# Patient Record
Sex: Female | Born: 1970 | Race: White | Hispanic: No | Smoking: Current every day smoker
Health system: Southern US, Community
[De-identification: ages and names within clinical notes are randomized; demographics above are authoritative.]

## PROBLEM LIST (undated history)

## (undated) DIAGNOSIS — R1011 Right upper quadrant pain: Secondary | ICD-10-CM

## (undated) DIAGNOSIS — F32A Depression, unspecified: Secondary | ICD-10-CM

## (undated) DIAGNOSIS — F329 Major depressive disorder, single episode, unspecified: Secondary | ICD-10-CM

## (undated) DIAGNOSIS — F419 Anxiety disorder, unspecified: Secondary | ICD-10-CM

## (undated) DIAGNOSIS — E7800 Pure hypercholesterolemia, unspecified: Secondary | ICD-10-CM

## (undated) DIAGNOSIS — Z79890 Hormone replacement therapy (postmenopausal): Secondary | ICD-10-CM

## (undated) DIAGNOSIS — G4730 Sleep apnea, unspecified: Secondary | ICD-10-CM

## (undated) HISTORY — DX: Sleep apnea, unspecified: G47.30

## (undated) HISTORY — DX: Right upper quadrant pain: R10.11

## (undated) HISTORY — DX: Hormone replacement therapy: Z79.890

## (undated) HISTORY — PX: ENDOMETRIAL ABLATION: SHX621

## (undated) HISTORY — DX: Major depressive disorder, single episode, unspecified: F32.9

## (undated) HISTORY — DX: Depression, unspecified: F32.A

## (undated) HISTORY — DX: Anxiety disorder, unspecified: F41.9

## (undated) HISTORY — DX: Pure hypercholesterolemia, unspecified: E78.00

---

## 2000-12-27 ENCOUNTER — Other Ambulatory Visit: Admission: RE | Admit: 2000-12-27 | Discharge: 2000-12-27 | Payer: Self-pay | Admitting: Obstetrics and Gynecology

## 2005-08-18 ENCOUNTER — Ambulatory Visit (HOSPITAL_COMMUNITY): Admission: RE | Admit: 2005-08-18 | Discharge: 2005-08-18 | Payer: Self-pay | Admitting: Otolaryngology

## 2005-09-03 ENCOUNTER — Ambulatory Visit (HOSPITAL_COMMUNITY): Admission: RE | Admit: 2005-09-03 | Discharge: 2005-09-03 | Payer: Self-pay | Admitting: Oncology

## 2006-06-08 ENCOUNTER — Ambulatory Visit (HOSPITAL_COMMUNITY): Admission: RE | Admit: 2006-06-08 | Discharge: 2006-06-08 | Payer: Self-pay | Admitting: Obstetrics & Gynecology

## 2006-07-07 ENCOUNTER — Ambulatory Visit (HOSPITAL_COMMUNITY): Admission: RE | Admit: 2006-07-07 | Discharge: 2006-07-07 | Payer: Self-pay | Admitting: Obstetrics & Gynecology

## 2006-07-07 ENCOUNTER — Encounter (INDEPENDENT_AMBULATORY_CARE_PROVIDER_SITE_OTHER): Payer: Self-pay | Admitting: Specialist

## 2007-07-21 ENCOUNTER — Other Ambulatory Visit: Admission: RE | Admit: 2007-07-21 | Discharge: 2007-07-21 | Payer: Self-pay | Admitting: Obstetrics and Gynecology

## 2007-08-18 ENCOUNTER — Emergency Department (HOSPITAL_COMMUNITY): Admission: EM | Admit: 2007-08-18 | Discharge: 2007-08-18 | Payer: Self-pay | Admitting: Emergency Medicine

## 2007-09-26 ENCOUNTER — Ambulatory Visit (HOSPITAL_COMMUNITY): Payer: Self-pay | Admitting: Psychiatry

## 2007-10-26 ENCOUNTER — Ambulatory Visit (HOSPITAL_COMMUNITY): Payer: Self-pay | Admitting: Psychiatry

## 2008-08-27 ENCOUNTER — Other Ambulatory Visit: Admission: RE | Admit: 2008-08-27 | Discharge: 2008-08-27 | Payer: Self-pay | Admitting: Obstetrics and Gynecology

## 2009-11-25 ENCOUNTER — Other Ambulatory Visit: Admission: RE | Admit: 2009-11-25 | Discharge: 2009-11-25 | Payer: Self-pay | Admitting: Obstetrics and Gynecology

## 2010-07-18 NOTE — H&P (Signed)
NAME:  Sabrina Waters, Sabrina Waters                 ACCOUNT NO.:  [id number]   MEDICAL RECORD NO.:  [id number]          PATIENT TYPE:  AMB   LOCATION:  DAY                           FACILITY:  [facility]   PHYSICIAN:  Lazaro Arms, M.D.   DATE OF BIRTH:  June 21, 1970   DATE OF ADMISSION:  07/07/2006  DATE OF DISCHARGE:  LH                              HISTORY & PHYSICAL   Sabrina Waters is a 40 year old white female gravida 1, para 1, who has suffered  with menorrhagia and dysmenorrhea now for quite a number of years.  She  had an ultrasound performed which essentially came back normal, normal  endometrial stripe, and a very small exophytic myoma which is not  clinically relevant.  She declines oral contraceptive therapy because  she does smoke and she is 40 years old and she also has headaches and  she is under the care of [facility].  Her periods are  lasting 7-10 days and they are heavy for 5 of those days with clotting  as well.  We discussed options and decided to proceed with hysteroscopy,  D&C, and endometrial ablation.   PAST MEDICAL HISTORY:  Significant for anxiety, depression, allergies,  hyperlipidemia, and headaches.   PAST SURGICAL HISTORY:  Just numerous mole removals.   BIRTH CONTROL METHOD:  Her husband has had a vasectomy.   PAST OBSTETRICAL HISTORY:  Vaginal delivery.   REVIEW OF SYSTEMS:  Otherwise negative.   MEDICATIONS:  BuSpar, Tagamet, and Allegra as needed.   ALLERGIES:  CIPROFLOXACIN.   PHYSICAL EXAMINATION:  VITAL SIGNS:  Weight is 178 pounds, blood  pressure 128/80.  HEENT:  Unremarkable.  Thyroid is normal.  LUNGS:  Clear.  HEART:  Regular rate and rhythm without murmurs, rubs, or gallops.  BREASTS:  Without mass, discharge, or skin changes.  ABDOMEN:  Benign.  No hepatosplenomegaly or masses.  PELVIC:  She has normal external genitalia.  Vagina is normal female  without discharge.  Uterus and ovaries are normal size and nontender.  EXTREMITIES:  Warm with no  edema.  NEUROLOGY:  Grossly intact.   LABORATORY DATA:  Her last hemoglobin is 14.9, hematocrit 44.3.  Her CMP  was normal.  Her LDL was slightly elevated at 125, HDL 28.   IMPRESSION:  1. Menometrorrhagia.  2. Dysmenorrhea.   PLAN:  The patient is admitted for hysteroscopy, D&C, and endometrial  ablation.  She understands the risks, benefits, indications, and  alternatives and we will proceed.      Lazaro Arms, M.D.  Electronically Signed     LHE/MEDQ  D:  07/06/2006  T:  07/07/2006  Job:  [id number]

## 2010-07-18 NOTE — Op Note (Signed)
NAME:  Sabrina Waters, Sabrina Waters                 ACCOUNT NO.:  [id number]   MEDICAL RECORD NO.:  [id number]          PATIENT TYPE:  AMB   LOCATION:  DAY                           FACILITY:  [facility]   PHYSICIAN:  Lazaro Arms, M.D.   DATE OF BIRTH:  07-05-1970   DATE OF PROCEDURE:  07/07/2006  DATE OF DISCHARGE:                               OPERATIVE REPORT   PREOPERATIVE DIAGNOSES:  1. Menometrorrhagia.  2. Dysmenorrhea.   POSTOPERATIVE DIAGNOSES:  1. Menometrorrhagia.  2. Dysmenorrhea.   PROCEDURES:  1. Hysteroscopy.  2. Dilatation and curettage.  3. Endometrial ablation.   SURGEON:  Lazaro Arms, M.D.   ANESTHESIA:  General endotracheal.   FINDINGS:  The patient had a normal endometrial cavity, a lot of  decidual tissue, endometrial tissue but no polyps and no submucosal  myomas.   DESCRIPTION OF OPERATION:  The patient was taken to the operating room,  placed in supine position and underwent laryngeal mask airway  anesthesia.  She was then placed in dorsal spine position and prepped  and draped in the usual sterile fashion.  A Graves speculum was placed.  The cervix was grasped.  The cervix was dilated serially to allow  passage of the hysteroscope.  Hysteroscopy was performed and, again, the  above-noted findings were seen.  A vigorous uterine curettage was  performed.  A large amount of tissue was returned and sent to Pathology  for evaluation.  ThermaChoice III endometrial ablation was then  performed.  Total therapy time was 23 minutes and 2 seconds.  It could  not quite heat up to 87 degrees Celsius.  We heated up to 85 degrees  three times and finally got to 87 the last time.  We suspect there may  have been an aging  cord, but she did receive the appropriate  therapy with the 8 minutes of actual treatment cycle and required 42 mL  of D5W to maintain a pressure of 200 mmHg.  The patient was awoke from  anesthesia and taken to the recovery room in good and stable  condition.  All counts were correct.      Lazaro Arms, M.D.  Electronically Signed     LHE/MEDQ  D:  07/07/2006  T:  07/07/2006  Job:  [id number]

## 2010-11-27 LAB — POCT CARDIAC MARKERS
CKMB, poc: <1 — ABNORMAL LOW
Myoglobin, poc: 35.7
Troponin i, poc: <0.05

## 2010-11-27 LAB — POCT I-STAT, CHEM 8
Chloride: 106
Creatinine, Ser: 0.7
Sodium: 141
TCO2: 26

## 2011-02-10 ENCOUNTER — Other Ambulatory Visit (HOSPITAL_COMMUNITY)
Admission: RE | Admit: 2011-02-10 | Discharge: 2011-02-10 | Disposition: A | Discharge status: Home / Self Care | Payer: BC Managed Care – PPO | Source: Ambulatory Visit | Attending: Obstetrics and Gynecology | Admitting: Obstetrics and Gynecology

## 2011-02-10 ENCOUNTER — Other Ambulatory Visit: Payer: Self-pay | Admitting: Adult Health

## 2011-02-10 DIAGNOSIS — Z01419 Encounter for gynecological examination (general) (routine) without abnormal findings: Secondary | ICD-10-CM | POA: Insufficient documentation

## 2011-05-05 ENCOUNTER — Other Ambulatory Visit: Payer: Self-pay | Admitting: Obstetrics and Gynecology

## 2011-06-26 ENCOUNTER — Other Ambulatory Visit: Payer: Self-pay | Admitting: Obstetrics and Gynecology

## 2012-09-12 ENCOUNTER — Ambulatory Visit (INDEPENDENT_AMBULATORY_CARE_PROVIDER_SITE_OTHER): Payer: BC Managed Care – PPO | Admitting: Adult Health

## 2012-09-12 ENCOUNTER — Encounter: Payer: Self-pay | Admitting: Adult Health

## 2012-09-12 VITALS — BP 122/86 | Ht 64.0 in | Wt 180.0 lb

## 2012-09-12 DIAGNOSIS — E7800 Pure hypercholesterolemia, unspecified: Secondary | ICD-10-CM | POA: Insufficient documentation

## 2012-09-12 DIAGNOSIS — F411 Generalized anxiety disorder: Secondary | ICD-10-CM

## 2012-09-12 DIAGNOSIS — F329 Major depressive disorder, single episode, unspecified: Secondary | ICD-10-CM

## 2012-09-12 DIAGNOSIS — F419 Anxiety disorder, unspecified: Secondary | ICD-10-CM | POA: Insufficient documentation

## 2012-09-12 DIAGNOSIS — R5383 Other fatigue: Secondary | ICD-10-CM

## 2012-09-12 DIAGNOSIS — F32A Depression, unspecified: Secondary | ICD-10-CM | POA: Insufficient documentation

## 2012-09-12 DIAGNOSIS — R5381 Other malaise: Secondary | ICD-10-CM

## 2012-09-12 LAB — COMPREHENSIVE METABOLIC PANEL
AST: 21 U/L (ref 0–37)
Albumin: 4.4 g/dL (ref 3.5–5.2)
Alkaline Phosphatase: 64 U/L (ref 39–117)
Calcium: 9.3 mg/dL (ref 8.4–10.5)
Chloride: 106 mEq/L (ref 96–112)
Creat: 0.8 mg/dL (ref 0.50–1.10)
Glucose, Bld: 82 mg/dL (ref 70–99)
Potassium: 4.6 mEq/L (ref 3.5–5.3)
Sodium: 141 mEq/L (ref 135–145)
Total Bilirubin: 0.3 mg/dL (ref 0.3–1.2)

## 2012-09-12 LAB — CBC
Hemoglobin: 15.3 g/dL — ABNORMAL HIGH (ref 12.0–15.0)
MCV: 91.5 fL (ref 78.0–100.0)
WBC: 8.5 10*3/uL (ref 4.0–10.5)

## 2012-09-12 NOTE — Progress Notes (Signed)
Subjective:     Patient ID: Sabrina Waters, female   DOB: 01-23-1971, 42 y.o.   MRN: [id number]  HPI Sabrina Waters is a 42 year old white female in complaining that meds not working as well.She is teary at times,wants to stay home more, does not sleep well and feels hot at night and she is tired all the time.She had an endo ablation so she does not have periods.She denies any suicidal ideations.  Review of Systems Positives in HPI Reviewed past medical,surgical, social and family history. Reviewed medications and allergies.     Objective:   Physical Exam BP 122/86  Ht 5\' 4"  (1.626 m)  Wt 180 lb (81.647 kg)  BMI 30.88 kg/m2   Talk only-see HPI  Assessment:      Depression Anxiety Fatigue    Plan:      Check CBC,CMP,TSH and FSH, to rule out anemia,hypothyroid and menopause Will follow labs in am and discuss any med changes then Follow up in 4 weeks Review handout on depression

## 2012-09-12 NOTE — Patient Instructions (Addendum)

## 2012-09-13 ENCOUNTER — Telehealth: Payer: Self-pay | Admitting: Adult Health

## 2012-09-13 NOTE — Telephone Encounter (Signed)
Pt aware of labs results and that FSH 41.5.discused HRT and pt to research it and let me know if she wants to try it.

## 2012-09-14 ENCOUNTER — Telehealth: Payer: Self-pay | Admitting: Adult Health

## 2012-09-14 MED ORDER — ESTRADIOL 0.52 MG/0.87 GM (0.06%) TD GEL: TRANSDERMAL | Status: DC

## 2012-09-14 MED ORDER — PROGESTERONE MICRONIZED 200 MG PO CAPS: ORAL_CAPSULE | ORAL | Status: DC

## 2012-09-14 NOTE — Telephone Encounter (Signed)
Pt wants to try HRT will call to [facility]

## 2012-09-20 ENCOUNTER — Encounter: Payer: Self-pay | Admitting: Adult Health

## 2012-10-10 ENCOUNTER — Ambulatory Visit: Payer: BC Managed Care – PPO | Admitting: Adult Health

## 2012-11-17 ENCOUNTER — Other Ambulatory Visit: Payer: Self-pay | Admitting: Adult Health

## 2013-01-05 ENCOUNTER — Other Ambulatory Visit: Payer: Self-pay

## 2013-01-11 ENCOUNTER — Ambulatory Visit (INDEPENDENT_AMBULATORY_CARE_PROVIDER_SITE_OTHER): Payer: BC Managed Care – PPO | Admitting: Adult Health

## 2013-01-11 ENCOUNTER — Encounter: Payer: Self-pay | Admitting: Adult Health

## 2013-01-11 ENCOUNTER — Other Ambulatory Visit (HOSPITAL_COMMUNITY)
Admission: RE | Admit: 2013-01-11 | Discharge: 2013-01-11 | Disposition: A | Discharge status: Home / Self Care | Payer: BC Managed Care – PPO | Source: Ambulatory Visit | Attending: Adult Health | Admitting: Adult Health

## 2013-01-11 VITALS — BP 126/80 | HR 72 | Ht 64.75 in | Wt 184.0 lb

## 2013-01-11 DIAGNOSIS — Z1151 Encounter for screening for human papillomavirus (HPV): Secondary | ICD-10-CM | POA: Insufficient documentation

## 2013-01-11 DIAGNOSIS — Z1212 Encounter for screening for malignant neoplasm of rectum: Secondary | ICD-10-CM

## 2013-01-11 DIAGNOSIS — Z01419 Encounter for gynecological examination (general) (routine) without abnormal findings: Secondary | ICD-10-CM | POA: Insufficient documentation

## 2013-01-11 DIAGNOSIS — R1011 Right upper quadrant pain: Secondary | ICD-10-CM | POA: Insufficient documentation

## 2013-01-11 DIAGNOSIS — F329 Major depressive disorder, single episode, unspecified: Secondary | ICD-10-CM

## 2013-01-11 DIAGNOSIS — F419 Anxiety disorder, unspecified: Secondary | ICD-10-CM

## 2013-01-11 DIAGNOSIS — Z79890 Hormone replacement therapy (postmenopausal): Secondary | ICD-10-CM | POA: Insufficient documentation

## 2013-01-11 DIAGNOSIS — E7800 Pure hypercholesterolemia, unspecified: Secondary | ICD-10-CM

## 2013-01-11 HISTORY — DX: Right upper quadrant pain: R10.11

## 2013-01-11 LAB — COMPREHENSIVE METABOLIC PANEL
ALT: 18 U/L (ref 0–35)
Albumin: 4.1 g/dL (ref 3.5–5.2)
Alkaline Phosphatase: 60 U/L (ref 39–117)
BUN: 7 mg/dL (ref 6–23)
Calcium: 9.4 mg/dL (ref 8.4–10.5)
Glucose, Bld: 106 mg/dL — ABNORMAL HIGH (ref 70–99)
Sodium: 138 mEq/L (ref 135–145)
Total Bilirubin: 0.4 mg/dL (ref 0.3–1.2)

## 2013-01-11 LAB — LIPID PANEL
Cholesterol: 227 mg/dL — ABNORMAL HIGH (ref 0–200)
LDL Cholesterol: 153 mg/dL — ABNORMAL HIGH (ref 0–99)
Total CHOL/HDL Ratio: 8.1 Ratio
Triglycerides: 228 mg/dL — ABNORMAL HIGH (ref <150)
VLDL: 46 mg/dL — ABNORMAL HIGH (ref 0–40)

## 2013-01-11 LAB — HEMOCCULT GUIAC POC 1CARD (OFFICE): Fecal Occult Blood, POC: NEGATIVE

## 2013-01-11 LAB — CBC
MCHC: 34.6 g/dL (ref 30.0–36.0)
MCV: 93.1 fL (ref 78.0–100.0)
Platelets: 246 10*3/uL (ref 150–400)
RDW: 13 % (ref 11.5–15.5)
WBC: 8.3 10*3/uL (ref 4.0–10.5)

## 2013-01-11 LAB — TSH: TSH: 1.087 u[IU]/mL (ref 0.350–4.500)

## 2013-01-11 NOTE — Progress Notes (Signed)
Patient ID: Sabrina Waters, female   DOB: Oct 23, 1970, 42 y.o.   MRN: [id number] History of Present Illness: [name] is a 42 year old white female,divorced in for pap and physical.Complains of bloating,nausea and BMs weekly.She likes her HRT.Has taken tagamet OTC with little relief.   Current Medications, Allergies, Past Medical History, Past Surgical History, Family History and Social History were reviewed in [employer] record.   Past Medical History  Diagnosis Date  . Anxiety   . Depression   . Elevated cholesterol   . Hormone replacement therapy (HRT)   . RUQ pain 01/11/2013  Current outpatient prescriptions:buPROPion (WELLBUTRIN SR) 150 MG 12 hr tablet, Take 150 mg by mouth 2 (two) times daily., Disp: , Rfl: ;  busPIRone (BUSPAR) 15 MG tablet, Take 15 mg by mouth 3 (three) times daily., Disp: , Rfl: ;  Estradiol 0.52 MG/0.87 GM (0.06%) GEL, Apply once daily to upper arm, Disp: 144 g, Rfl: 3;  progesterone (PROMETRIUM) 200 MG capsule, Take 1 200 mg capsule by mouth at bedtime, Disp: 30 capsule, Rfl: 3  Past Surgical History  Procedure Laterality Date  . Endometrial ablation      Review of Systems: Patient denies any headaches, blurred vision, shortness of breath, chest pain, problems with urination, or intercourse. No joint pain , moods good with meds.Positives in HPI.    Physical Exam:BP 126/80  Pulse 72  Ht 5' 4.75" (1.645 m)  Wt 184 lb (83.462 kg)  BMI 30.84 kg/m2 General:  Well developed, well nourished, no acute distress Skin:  Warm and dry Neck:  Midline trachea, normal thyroid Lungs; Clear to auscultation bilaterally Breast:  No dominant palpable mass, retraction, or nipple discharge Cardiovascular: Regular rate and rhythm Abdomen:  Soft, no hepatosplenomegaly, has pain RUQ with palpation Pelvic:  External genitalia is normal in appearance.  The vagina is normal in appearance.  The cervix is smooth, pap performed with HPV.  Uterus is felt to be normal  size, shape, and contour.  No adnexal masses or tenderness noted. Rectal: Good sphincter tone, no polyps, or hemorrhoids felt.  Hemoccult negative. Extremities:  No swelling or varicosities noted Psych:  No mood changes, alert and cooperative, seems happy   Impression: Yearly gyn exam HRT RUQ pain, bloating with nausea and some constipation Anxiety and depression History of elevated cholesterol    Plan: Check CBC,CMP,TSH and lipids Abdominal US 11/17 1t 8 am at [facility] Physical in 1 year Mammogram now and yearly Get flu shot  Eat bland Continue current meds

## 2013-01-11 NOTE — Patient Instructions (Signed)
Physical in 1 year Mammogram now and yearly Follow up labs in 24 -48 hours Get GB US 11/17 at 8 am at [facility] do not eat or drink after midnight be there at 7:45 am Get flu shot  Eat bland

## 2013-01-13 ENCOUNTER — Telehealth: Payer: Self-pay | Admitting: Adult Health

## 2013-01-13 NOTE — Telephone Encounter (Signed)
Left message to call back about labs

## 2013-01-16 ENCOUNTER — Telehealth: Payer: Self-pay | Admitting: Adult Health

## 2013-01-16 ENCOUNTER — Ambulatory Visit (HOSPITAL_COMMUNITY)
Admission: RE | Admit: 2013-01-16 | Discharge: 2013-01-16 | Disposition: A | Discharge status: Home / Self Care | Payer: BC Managed Care – PPO | Source: Ambulatory Visit | Attending: Adult Health | Admitting: Adult Health

## 2013-01-16 DIAGNOSIS — R1011 Right upper quadrant pain: Secondary | ICD-10-CM | POA: Insufficient documentation

## 2013-01-16 DIAGNOSIS — Z9089 Acquired absence of other organs: Secondary | ICD-10-CM | POA: Insufficient documentation

## 2013-01-16 MED ORDER — ATORVASTATIN CALCIUM 20 MG PO TABS: 20.0000 mg | ORAL_TABLET | Freq: Every day | ORAL | Status: DC

## 2013-01-16 NOTE — Telephone Encounter (Signed)
Pt informed elevated cholesterol and triglycerides. Meds to be prescribed by Cyril Mourning, NP Pt also informed of ultrasound results of fatty liver pt to exercise and diet per Genelle Gather. Pt verbalized understanding will call office back in 8 weeks to repeat labs.

## 2013-01-16 NOTE — Addendum Note (Signed)
Addended by: Cyril Mourning A on: 01/16/2013 01:49 PM   Modules accepted: Orders

## 2013-01-23 ENCOUNTER — Other Ambulatory Visit: Payer: Self-pay | Admitting: Adult Health

## 2013-01-23 ENCOUNTER — Encounter: Payer: Self-pay | Admitting: Adult Health

## 2013-01-23 MED ORDER — SIMVASTATIN 20 MG PO TABS: 20.0000 mg | ORAL_TABLET | Freq: Every day | ORAL | Status: DC

## 2013-01-24 ENCOUNTER — Other Ambulatory Visit: Payer: Self-pay | Admitting: Adult Health

## 2013-02-27 ENCOUNTER — Other Ambulatory Visit: Payer: Self-pay | Admitting: Obstetrics and Gynecology

## 2013-04-07 ENCOUNTER — Other Ambulatory Visit: Payer: Self-pay | Admitting: Obstetrics and Gynecology

## 2013-12-18 ENCOUNTER — Telehealth: Payer: Self-pay | Admitting: Adult Health

## 2013-12-18 MED ORDER — SIMVASTATIN 20 MG PO TABS: 20.0000 mg | ORAL_TABLET | Freq: Every day | ORAL | Status: DC

## 2013-12-18 MED ORDER — PROGESTERONE MICRONIZED 200 MG PO CAPS: ORAL_CAPSULE | ORAL | Status: DC

## 2013-12-18 MED ORDER — BUPROPION HCL ER (SR) 150 MG PO TB12: ORAL_TABLET | ORAL | Status: DC

## 2013-12-18 MED ORDER — BUSPIRONE HCL 15 MG PO TABS: 15.0000 mg | ORAL_TABLET | Freq: Two times a day (BID) | ORAL | Status: DC

## 2013-12-18 NOTE — Telephone Encounter (Signed)
Needs refills on meds wants 90 day supply

## 2014-01-01 ENCOUNTER — Encounter: Payer: Self-pay | Admitting: Adult Health

## 2014-06-04 ENCOUNTER — Encounter: Payer: Self-pay | Admitting: Adult Health

## 2014-06-07 ENCOUNTER — Telehealth: Payer: Self-pay | Admitting: Adult Health

## 2014-06-07 MED ORDER — ESTRADIOL 1 MG PO TABS: 1.0000 mg | ORAL_TABLET | Freq: Every day | ORAL | Status: DC

## 2014-06-07 NOTE — Telephone Encounter (Signed)
Will rx estrace and stop gel due to cost, feels moody

## 2014-06-07 NOTE — Telephone Encounter (Signed)
Will stop gel will rx estrace po

## 2014-12-20 ENCOUNTER — Other Ambulatory Visit: Payer: Self-pay | Admitting: Adult Health

## 2015-01-16 ENCOUNTER — Other Ambulatory Visit: Payer: Self-pay | Admitting: Adult Health

## 2015-06-17 ENCOUNTER — Observation Stay (HOSPITAL_COMMUNITY)
Admission: EM | Admit: 2015-06-17 | Discharge: 2015-06-18 | Disposition: A | Discharge status: Home / Self Care | Payer: 59 | Attending: Internal Medicine | Admitting: Internal Medicine

## 2015-06-17 ENCOUNTER — Observation Stay (HOSPITAL_COMMUNITY): Payer: 59

## 2015-06-17 ENCOUNTER — Encounter (HOSPITAL_COMMUNITY): Payer: Self-pay | Admitting: *Deleted

## 2015-06-17 ENCOUNTER — Emergency Department (HOSPITAL_COMMUNITY): Payer: 59

## 2015-06-17 DIAGNOSIS — Z79899 Other long term (current) drug therapy: Secondary | ICD-10-CM | POA: Insufficient documentation

## 2015-06-17 DIAGNOSIS — F17210 Nicotine dependence, cigarettes, uncomplicated: Secondary | ICD-10-CM | POA: Insufficient documentation

## 2015-06-17 DIAGNOSIS — M791 Myalgia: Secondary | ICD-10-CM | POA: Diagnosis not present

## 2015-06-17 DIAGNOSIS — R200 Anesthesia of skin: Principal | ICD-10-CM

## 2015-06-17 DIAGNOSIS — F329 Major depressive disorder, single episode, unspecified: Secondary | ICD-10-CM | POA: Diagnosis not present

## 2015-06-17 DIAGNOSIS — G43909 Migraine, unspecified, not intractable, without status migrainosus: Secondary | ICD-10-CM | POA: Diagnosis not present

## 2015-06-17 DIAGNOSIS — G43109 Migraine with aura, not intractable, without status migrainosus: Secondary | ICD-10-CM | POA: Diagnosis present

## 2015-06-17 DIAGNOSIS — E785 Hyperlipidemia, unspecified: Secondary | ICD-10-CM | POA: Insufficient documentation

## 2015-06-17 DIAGNOSIS — F419 Anxiety disorder, unspecified: Secondary | ICD-10-CM | POA: Diagnosis present

## 2015-06-17 DIAGNOSIS — R208 Other disturbances of skin sensation: Secondary | ICD-10-CM | POA: Diagnosis not present

## 2015-06-17 DIAGNOSIS — E7800 Pure hypercholesterolemia, unspecified: Secondary | ICD-10-CM | POA: Diagnosis present

## 2015-06-17 DIAGNOSIS — F32A Depression, unspecified: Secondary | ICD-10-CM | POA: Diagnosis present

## 2015-06-17 HISTORY — DX: Anesthesia of skin: R20.0

## 2015-06-17 LAB — I-STAT CHEM 8, ED
BUN: 10 mg/dL (ref 6–20)
CALCIUM ION: 1.14 mmol/L (ref 1.12–1.23)
CHLORIDE: 102 mmol/L (ref 101–111)
CREATININE: 0.7 mg/dL (ref 0.44–1.00)
GLUCOSE: 98 mg/dL (ref 65–99)
HCT: 45 % (ref 36.0–46.0)
Hemoglobin: 15.3 g/dL — ABNORMAL HIGH (ref 12.0–15.0)
Potassium: 3.6 mmol/L (ref 3.5–5.1)
Sodium: 141 mmol/L (ref 135–145)
TCO2: 25 mmol/L (ref 0–100)

## 2015-06-17 LAB — URINALYSIS, ROUTINE W REFLEX MICROSCOPIC
Bilirubin Urine: NEGATIVE
GLUCOSE, UA: NEGATIVE mg/dL
HGB URINE DIPSTICK: NEGATIVE
Ketones, ur: NEGATIVE mg/dL
Nitrite: NEGATIVE
PH: 5.5 (ref 5.0–8.0)
PROTEIN: NEGATIVE mg/dL
Specific Gravity, Urine: 1.01 (ref 1.005–1.030)

## 2015-06-17 LAB — COMPREHENSIVE METABOLIC PANEL
ALK PHOS: 64 U/L (ref 38–126)
ALT: 20 U/L (ref 14–54)
AST: 18 U/L (ref 15–41)
Albumin: 4.3 g/dL (ref 3.5–5.0)
Anion gap: 10 (ref 5–15)
BILIRUBIN TOTAL: 0.3 mg/dL (ref 0.3–1.2)
BUN: 11 mg/dL (ref 6–20)
CALCIUM: 9.3 mg/dL (ref 8.9–10.3)
CHLORIDE: 104 mmol/L (ref 101–111)
CO2: 25 mmol/L (ref 22–32)
CREATININE: 0.74 mg/dL (ref 0.44–1.00)
GFR calc Af Amer: >60 mL/min (ref >60)
Glucose, Bld: 104 mg/dL — ABNORMAL HIGH (ref 65–99)
Potassium: 3.8 mmol/L (ref 3.5–5.1)
Sodium: 139 mmol/L (ref 135–145)
TOTAL PROTEIN: 7.7 g/dL (ref 6.5–8.1)

## 2015-06-17 LAB — DIFFERENTIAL
BASOS ABS: 0.1 10*3/uL (ref 0.0–0.1)
Basophils Relative: 0 %
Eosinophils Absolute: 0.1 10*3/uL (ref 0.0–0.7)
Eosinophils Relative: 1 %
LYMPHS ABS: 4.8 10*3/uL — AB (ref 0.7–4.0)
LYMPHS PCT: 37 %
MONOS PCT: 6 %
Monocytes Absolute: 0.8 10*3/uL (ref 0.1–1.0)
NEUTROS ABS: 7.4 10*3/uL (ref 1.7–7.7)
Neutrophils Relative %: 56 %

## 2015-06-17 LAB — RAPID URINE DRUG SCREEN, HOSP PERFORMED
Amphetamines: NOT DETECTED
BARBITURATES: NOT DETECTED
BENZODIAZEPINES: NOT DETECTED
Cocaine: NOT DETECTED
Opiates: NOT DETECTED
Tetrahydrocannabinol: NOT DETECTED

## 2015-06-17 LAB — PROTIME-INR
INR: 1.01 (ref 0.00–1.49)
Prothrombin Time: 13.5 seconds (ref 11.6–15.2)

## 2015-06-17 LAB — CBC
HEMATOCRIT: 44.8 % (ref 36.0–46.0)
HEMOGLOBIN: 15.1 g/dL — AB (ref 12.0–15.0)
MCH: 31.8 pg (ref 26.0–34.0)
MCHC: 33.7 g/dL (ref 30.0–36.0)
MCV: 94.3 fL (ref 78.0–100.0)
Platelets: 264 10*3/uL (ref 150–400)
RBC: 4.75 MIL/uL (ref 3.87–5.11)
RDW: 12.2 % (ref 11.5–15.5)
WBC: 13.1 10*3/uL — AB (ref 4.0–10.5)

## 2015-06-17 LAB — URINE MICROSCOPIC-ADD ON

## 2015-06-17 LAB — PREGNANCY, URINE: Preg Test, Ur: NEGATIVE

## 2015-06-17 LAB — I-STAT TROPONIN, ED: TROPONIN I, POC: 0 ng/mL (ref 0.00–0.08)

## 2015-06-17 LAB — ETHANOL: Alcohol, Ethyl (B): <5 mg/dL (ref <5)

## 2015-06-17 LAB — TSH: TSH: 2.075 u[IU]/mL (ref 0.350–4.500)

## 2015-06-17 LAB — APTT: APTT: 26 s (ref 24–37)

## 2015-06-17 MED ORDER — ENOXAPARIN SODIUM 40 MG/0.4ML ~~LOC~~ SOLN
40.0000 mg | SUBCUTANEOUS | Status: DC
  Administered 2015-06-17: 40 mg via SUBCUTANEOUS
  Filled 2015-06-17: qty 0.4

## 2015-06-17 MED ORDER — ESTRADIOL 1 MG PO TABS
ORAL_TABLET | ORAL | Status: AC
  Filled 2015-06-17: qty 1

## 2015-06-17 MED ORDER — ESTRADIOL 1 MG PO TABS
1.0000 mg | ORAL_TABLET | Freq: Every day | ORAL | Status: DC
  Administered 2015-06-17: 1 mg via ORAL
  Filled 2015-06-17 (×2): qty 1

## 2015-06-17 MED ORDER — TOPIRAMATE 25 MG PO TABS
ORAL_TABLET | ORAL | Status: AC
  Filled 2015-06-17: qty 1

## 2015-06-17 MED ORDER — BUSPIRONE HCL 5 MG PO TABS
15.0000 mg | ORAL_TABLET | Freq: Two times a day (BID) | ORAL | Status: DC
  Administered 2015-06-17 – 2015-06-18 (×2): 15 mg via ORAL
  Filled 2015-06-17 (×2): qty 3

## 2015-06-17 MED ORDER — VITAMIN C 500 MG PO TABS
500.0000 mg | ORAL_TABLET | Freq: Two times a day (BID) | ORAL | Status: DC
  Administered 2015-06-17 – 2015-06-18 (×2): 500 mg via ORAL
  Filled 2015-06-17 (×3): qty 1

## 2015-06-17 MED ORDER — ASPIRIN 81 MG PO CHEW
81.0000 mg | CHEWABLE_TABLET | Freq: Every day | ORAL | Status: DC
  Administered 2015-06-17 – 2015-06-18 (×2): 81 mg via ORAL
  Filled 2015-06-17 (×2): qty 1

## 2015-06-17 MED ORDER — BUPROPION HCL ER (SR) 150 MG PO TB12
ORAL_TABLET | ORAL | Status: AC
  Filled 2015-06-17: qty 1

## 2015-06-17 MED ORDER — SIMVASTATIN 20 MG PO TABS
20.0000 mg | ORAL_TABLET | Freq: Every day | ORAL | Status: DC
  Administered 2015-06-17: 20 mg via ORAL
  Filled 2015-06-17: qty 1

## 2015-06-17 MED ORDER — PROGESTERONE MICRONIZED 200 MG PO CAPS
200.0000 mg | ORAL_CAPSULE | Freq: Every day | ORAL | Status: DC
  Administered 2015-06-17: 200 mg via ORAL
  Filled 2015-06-17 (×2): qty 1

## 2015-06-17 MED ORDER — ONDANSETRON HCL 4 MG/2ML IJ SOLN: 4.0000 mg | Freq: Three times a day (TID) | INTRAMUSCULAR | Status: AC | PRN

## 2015-06-17 MED ORDER — TOPIRAMATE 25 MG PO TABS
25.0000 mg | ORAL_TABLET | Freq: Two times a day (BID) | ORAL | Status: DC
  Administered 2015-06-17 – 2015-06-18 (×2): 25 mg via ORAL
  Filled 2015-06-17 (×5): qty 1

## 2015-06-17 MED ORDER — NICOTINE 14 MG/24HR TD PT24
14.0000 mg | MEDICATED_PATCH | Freq: Every day | TRANSDERMAL | Status: DC
  Administered 2015-06-17 – 2015-06-18 (×2): 14 mg via TRANSDERMAL
  Filled 2015-06-17 (×2): qty 1

## 2015-06-17 MED ORDER — BUPROPION HCL ER (SR) 150 MG PO TB12
150.0000 mg | ORAL_TABLET | Freq: Two times a day (BID) | ORAL | Status: DC
  Administered 2015-06-17 – 2015-06-18 (×2): 150 mg via ORAL
  Filled 2015-06-17 (×5): qty 1

## 2015-06-17 NOTE — H&P (Signed)
[facility] History and Physical  Sabrina PilgrimRobin C Beyene WUJ:[id number]:[id number] DOB: [birth year]    PCP:   Cyril MourningJennifer Griffin, NP   Chief Complaint: HA and numbness of left face.  HPI: Sabrina Waters is an 45 y.o. 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  He has no nausea, vomiting, but admitted to have HA.  No visual changes, and no focal weakness.  There was no confusion.  Work up in the ER included a negative head CT, and serology was not remarkable.  She had been on Topamax before, and stopped, but did not recall any bad side effects.  She does smoke about 1ppd, and drink socially.  She denied drug use.  ECG showed sinus tach and no acute ST T changes.  Neurotelemetry was consulted by EDP and recommended admission to r/out CVA.   Rewiew of Systems:  Constitutional: Negative for malaise, fever and chills. No significant weight loss or weight gain Eyes: Negative for eye pain, redness and discharge, diplopia, visual changes, or flashes of light. ENMT: Negative for ear pain, hoarseness, nasal congestion, sinus pressure and sore throat. No headaches; tinnitus, drooling, or problem swallowing. Cardiovascular: Negative for chest pain, palpitations, diaphoresis, dyspnea and peripheral edema. ; No orthopnea, PND Respiratory: Negative for cough, hemoptysis, wheezing and stridor. No pleuritic chestpain. Gastrointestinal: Negative for nausea, vomiting, diarrhea, constipation, abdominal pain, melena, blood in stool, hematemesis, jaundice and rectal bleeding.    Genitourinary: Negative for frequency, dysuria, incontinence,flank pain and hematuria; Musculoskeletal: Negative for back pain and neck pain. Negative for swelling and trauma.;  Skin: . Negative for pruritus, rash, abrasions,  bruising and skin lesion.; ulcerations Neuro: Negative for headache, lightheadedness and neck stiffness. Negative for weakness, altered level of consciousness , altered mental status, extremity weakness, burning feet, involuntary movement, seizure and syncope.  Psych: negative for anxiety, depression, insomnia, tearfulness, panic attacks, hallucinations, paranoia, suicidal or homicidal ideation    Past Medical History  Diagnosis Date  . Anxiety   . Depression   . Elevated cholesterol   . Hormone replacement therapy (HRT)   . RUQ pain 01/11/2013    Past Surgical History  Procedure Laterality Date  . Endometrial ablation      Medications:  HOME MEDS: Prior to Admission medications   Medication Sig Start Date End Date Taking? Authorizing Provider  buPROPion (WELLBUTRIN SR) 150 MG 12 hr tablet TAKE 1 TABLET BY MOUTH TWICE DAILY 12/20/14  Yes Adline PotterJennifer A Griffin, NP  busPIRone (BUSPAR) 15 MG tablet TAKE 1 TABLET BY MOUTH TWICE DAILY 12/20/14  Yes Adline PotterJennifer A Griffin, NP  estradiol (ESTRACE) 1 MG tablet Take 1 tablet (1 mg total) by mouth daily. Patient taking differently: Take 1 mg by mouth at bedtime.  06/07/14  Yes Adline PotterJennifer A Griffin, NP  progesterone (PROMETRIUM) 200 MG capsule TAKE 1 CAPSULE BY MOUTH EVERY NIGHT AT BEDTIME 01/16/15  Yes Adline PotterJennifer A Griffin, NP  Pseudoephedrine-Ibuprofen (ADVIL COLD/SINUS PO) Take 1 tablet by mouth daily.   Yes Historical Provider, MD  simvastatin (ZOCOR) 20 MG tablet TAKE 1 TABLET BY MOUTH DAILY Patient taking differently: TAKE 1 TABLET BY MOUTH AT BEDTIME. 01/16/15  Yes Adline PotterJennifer A Griffin, NP     Allergies:  Allergies  Allergen Reactions  . Sulfa Antibiotics Nausea And Vomiting    Social  History:   reports that she has been smoking Cigarettes.  She has a 12.5 pack-year smoking history. She has never used smokeless tobacco. She reports that she does not drink alcohol or use illicit drugs.  Family History: Family History  Problem Relation Age  of Onset  . Hypertension Mother   . Mental illness Sister   . Hypertension Brother      Physical Exam: Filed Vitals:   06/17/15 1330 06/17/15 1402 06/17/15 1403 06/17/15 1430  BP: 109/69 125/85 125/85 124/77  Pulse: 117 112 114 106  Resp: SpO2: 99% 97% 97% 96%   Blood pressure 124/77, pulse 106, resp. rate 15, SpO2 96 %.  GEN:  Pleasant  patient lying in the stretcher in no acute distress; cooperative with exam. PSYCH:  alert and oriented x4; does not appear anxious or depressed; affect is appropriate. HEENT: Mucous membranes pink and anicteric; PERRLA; EOM intact; no cervical lymphadenopathy nor thyromegaly or carotid bruit; no JVD; There were no stridor. Neck is very supple. Breasts:: Not examined CHEST WALL: No tenderness CHEST: Normal respiration, clear to auscultation bilaterally.  HEART: Regular rate and rhythm.  There are no murmur, rub, or gallops.   BACK: No kyphosis or scoliosis; no CVA tenderness ABDOMEN: soft and non-tender; no masses, no organomegaly, normal abdominal bowel sounds; no pannus; no intertriginous candida. There is no rebound and no distention. Rectal Exam: Not done EXTREMITIES: No bone or joint deformity; age-appropriate arthropathy of the hands and knees; no edema; no ulcerations.  There is no calf tenderness. Genitalia: not examined PULSES: 2+ and symmetric SKIN: Normal hydration no rash or ulceration CNS: Cranial nerves 2-12 grossly intact no focal lateralizing neurologic deficit.  Speech is fluent; uvula elevated with phonation, facial symmetry and tongue midline. DTR are normal bilaterally, cerebella exam is intact, barbinski is negative and strengths are equaled bilaterally.  No sensory loss.   Labs on Admission:  Basic Metabolic Panel:  Recent Labs Lab 06/17/15 1230 06/17/15 1242  NA 139 141  K 3.8 3.6  CL 104 102  CO2 25  --   GLUCOSE 104* 98  BUN 11 10  CREATININE 0.74 0.70  CALCIUM 9.3  --    Liver Function  Tests:  Recent Labs Lab 06/17/15 1230  AST 18  ALT 20  ALKPHOS 64  BILITOT 0.3  PROT 7.7  ALBUMIN 4.3   CBC:  Recent Labs Lab 06/17/15 1230 06/17/15 1242  WBC 13.1*  --   NEUTROABS 7.4  --   HGB 15.1* 15.3*  HCT 44.8 45.0  MCV 94.3  --   PLT 264  --     Radiological Exams on Admission: Ct Head Wo Contrast  06/17/2015  CLINICAL DATA:  Code stroke. Sudden onset of left-sided numbness and tingling at 11:30 a.m. today. EXAM: CT HEAD WITHOUT CONTRAST TECHNIQUE: Contiguous axial images were obtained from the base of the skull through the vertex without intravenous contrast. COMPARISON:  MRI dated 09/03/2005 FINDINGS: No mass lesion. No midline shift. No acute hemorrhage or hematoma. No extra-axial fluid collections. No evidence of acute infarction. Brain parenchyma is normal. Bones are normal. IMPRESSION: Normal exam. Critical Value/emergent results were called by telephone at the time of interpretation on 06/17/2015 at 1:01 pm to Dr. Eber Hong , who verbally acknowledged these results. Electronically Signed   By: Francene Boyers M.D.   On: 06/17/2015 13:02    EKG: Independently reviewed.    Assessment/Plan Present on Admission:  . Elevated cholesterol . Anxiety .  Depression . Complicated migraine  PLAN:  Will admit her to obtain MRI of the head, but I suspect she has complicated migraine.  Will continue with HRT, and add ASA   Per day.  I recommended that she stopped cigarettes, and she would like to try nicotine patches.   Will obtain MRI to see if she has any suggestion of multiple sclerosis.  I will start her on Topamax low dose, and add Vit C to her Topamax.  She is stable, full code, and will be admitted OBS to [facility] service.  Her home meds of Welbutrin and Buspar will be continued.  For her hyperlipidemia, will continue with her statin.   Thank you and Good Day.   Other plans as per orders. Code Status: FULL CODE>    Houston Siren, MD. FACP [facility] Pager  [phone] 7pm to 7am.  06/17/2015, 3:25 PM

## 2015-06-17 NOTE — ED Notes (Signed)
Called SOC for Code Stroke. 

## 2015-06-17 NOTE — ED Provider Notes (Signed)
At 11:30 the pt was eating almonds and noticed acute onset of L facial numbness / cheek and face and some LUE weakness and numbness - consistent, midly improving Hx of CVA in the family.  Pt is anxious and worried about the same.  The pt has no n/v and no cp / sob.   On exam has L sided facial numbness but no droop, normal strength in all 4 ext but has dec sensation to the LUE between the shoulder and the elbow.  CN 3-12 otherwise intact as are the visual fields. She is mildly tachycardic Activated code stroke on pt arrival, Ct negative W/u neg otherwise SOC recommendation are for admission and Stroke w/u.  D/w Dr. Conley RollsLe at 2:40 PM - agreeable to observational admission.  MRI ordered with and without contrast.  .  EKG Interpretation  Date/Time:  Monday June 17 2015 12:35:35 EDT Ventricular Rate:  119 PR Interval:  150 QRS Duration: 84 QT Interval:  323 QTC Calculation: 454 R Axis:   62 Text Interpretation:  Sinus tachycardia Low voltage, precordial leads Since last tracing rate faster Confirmed by Charmelle Soh  MD, Neymar Dowe ([id number]) on 06/17/2015 1:02:23 PM      Medical screening examination/treatment/procedure(s) were conducted as a shared visit with non-physician practitioner(s) and myself.  I personally evaluated the patient during the encounter.  Clinical Impression:   Final diagnoses:  Numbness of face         Eber HongBrian Khadeem Rockett, MD 06/18/15 1435

## 2015-06-17 NOTE — Progress Notes (Signed)
1245 call from secretary 1246 call from rn [phone] beeper 1253 pt on table RN did not come with patient 1257 call from RN asking what time we scanned patient 1258 completed and called GR

## 2015-06-17 NOTE — ED Notes (Signed)
Beeped out Code Stroke 

## 2015-06-17 NOTE — ED Notes (Signed)
Pt reports frontal headache radiating to the back of head.  States this began about 30 minutes prior to her symptoms beginning.  Took Advil Cold and Sinus prior to symptoms beginning.

## 2015-06-17 NOTE — ED Provider Notes (Signed)
CSN: [id number]     Arrival date & time 06/17/15  1227 History   First MD Initiated Contact with Patient 06/17/15 1232     Chief Complaint  Patient presents with  . Numbness    @ (Consider location/radiation/quality/duration/timing/severity/associated sxs/prior Treatment) HPI Comments: Patient is a 45 y/o female with history of anxiety, depression and hyperlipidemia presents to the ED with numbness since 11:30am. A code stroke was activated. Patient states she had a normal morning and at 11:30 was eating almonds and noticed her left cheek was numb. She then felt sharp pains shooting down her left arm. She otherwise feels normal. She denies visual changes, facial droop, weakness, slurred speech, hearing loss, syncopy, dizziness, N, V, CP, SOB.   The history is provided by the patient.    Past Medical History  Diagnosis Date  . Anxiety   . Depression   . Elevated cholesterol   . Hormone replacement therapy (HRT)   . RUQ pain 01/11/2013   Past Surgical History  Procedure Laterality Date  . Endometrial ablation     Family History  Problem Relation Age of Onset  . Hypertension Mother   . Mental illness Sister   . Hypertension Brother    Social History  Substance Use Topics  . Smoking status: Current Every Day Smoker -- 0.50 packs/day for 25 years    Types: Cigarettes  . Smokeless tobacco: Never Used  . Alcohol Use: No     Comment: occ   OB History    Gravida Para Term Preterm AB TAB SAB Ectopic Multiple Living   Review of Systems  Musculoskeletal: Positive for myalgias.  Neurological: Positive for numbness. Negative for weakness.  All other systems reviewed and are negative.     Allergies  Sulfa antibiotics  Home Medications   Prior to Admission medications   Medication Sig Start Date End Date Taking? Authorizing Provider  buPROPion (WELLBUTRIN SR) 150 MG 12 hr tablet TAKE 1 TABLET BY MOUTH TWICE DAILY 12/20/14  Yes Adline Potter, NP  busPIRone (BUSPAR) 15 MG tablet TAKE 1 TABLET BY MOUTH TWICE DAILY 12/20/14  Yes Adline Potter, NP  estradiol (ESTRACE) 1 MG tablet Take 1 tablet (1 mg total) by mouth daily. Patient taking differently: Take 1 mg by mouth at bedtime.  06/07/14  Yes Adline Potter, NP  progesterone (PROMETRIUM) 200 MG capsule TAKE 1 CAPSULE BY MOUTH EVERY NIGHT AT BEDTIME 01/16/15  Yes Adline Potter, NP  Pseudoephedrine-Ibuprofen (ADVIL COLD/SINUS PO) Take 1 tablet by mouth daily.   Yes Historical Provider, MD  simvastatin (ZOCOR) 20 MG tablet TAKE 1 TABLET BY MOUTH DAILY Patient taking differently: TAKE 1 TABLET BY MOUTH AT BEDTIME. 01/16/15  Yes Adline Potter, NP   BP 124/77 mmHg  Pulse 106  Resp 15  SpO2 96% Physical Exam  Constitutional: She is oriented to person, place, and time. She appears well-developed and well-nourished. No distress.  HENT:  Head: Normocephalic and atraumatic.  Eyes: Conjunctivae and EOM are normal. Pupils are equal, round, and reactive to light.  Neck: Normal range of motion.  Cardiovascular: Normal rate, regular rhythm, normal heart sounds and intact distal pulses.   Pulmonary/Chest: Effort normal and breath sounds normal.  Abdominal: Soft. She exhibits no distension. There is no tenderness.  Musculoskeletal: Normal range of motion. She exhibits no edema or tenderness.  Neurological: She is alert and oriented to person, place, and time.  Coordination normal.  Cranial nerves 3-12 intact including vision, sensory deficit of left cheek and LUE between shoulder and elbow, sensation intact to RUE and bilateral LE, strength 5/5 throughout, finger to nose intact, pronator drift negative  Skin: Skin is warm and dry. No rash noted. She is not diaphoretic.  Psychiatric: She has a normal mood and affect. Her behavior is normal.    ED Course  Procedures (including critical care time) Labs Review Labs Reviewed  CBC - Abnormal; Notable for the following:     WBC 13.1 (*)    Hemoglobin 15.1 (*)    All other components within normal limits  DIFFERENTIAL - Abnormal; Notable for the following:    Lymphs Abs 4.8 (*)    All other components within normal limits  COMPREHENSIVE METABOLIC PANEL - Abnormal; Notable for the following:    Glucose, Bld 104 (*)    All other components within normal limits  URINALYSIS, ROUTINE W REFLEX MICROSCOPIC (NOT AT [facility]) - Abnormal; Notable for the following:    APPearance CLOUDY (*)    Leukocytes, UA MODERATE (*)    All other components within normal limits  URINE MICROSCOPIC-ADD ON - Abnormal; Notable for the following:    Squamous Epithelial / LPF 6-30 (*)    Bacteria, UA MANY (*)    All other components within normal limits  I-STAT CHEM 8, ED - Abnormal; Notable for the following:    Hemoglobin 15.3 (*)    All other components within normal limits  ETHANOL  PROTIME-INR  APTT  URINE RAPID DRUG SCREEN, HOSP PERFORMED  PREGNANCY, URINE  I-STAT TROPOININ, ED    Imaging Review Ct Head Wo Contrast  06/17/2015  CLINICAL DATA:  Code stroke. Sudden onset of left-sided numbness and tingling at 11:30 a.m. today. EXAM: CT HEAD WITHOUT CONTRAST TECHNIQUE: Contiguous axial images were obtained from the base of the skull through the vertex without intravenous contrast. COMPARISON:  MRI dated 09/03/2005 FINDINGS: No mass lesion. No midline shift. No acute hemorrhage or hematoma. No extra-axial fluid collections. No evidence of acute infarction. Brain parenchyma is normal. Bones are normal. IMPRESSION: Normal exam. Critical Value/emergent results were called by telephone at the time of interpretation on 06/17/2015 at 1:01 pm to Dr. Eber Hong , who verbally acknowledged these results. Electronically Signed   By: Francene Boyers M.D.   On: 06/17/2015 13:02   I have personally reviewed and evaluated these images and lab results as part of my medical decision-making.   EKG Interpretation   Date/Time:  Monday June 17 2015  12:35:35 EDT Ventricular Rate:  119 PR Interval:  150 QRS Duration: 84 QT Interval:  323 QTC Calculation: 454 R Axis:   62 Text Interpretation:  Sinus tachycardia Low voltage, precordial leads  Since last tracing rate faster Confirmed by MILLER  MD, BRIAN ([id number]) on  06/17/2015 1:02:23 PM      MDM   Final diagnoses:  Numbness of face   Code stroke called on pt due to facial numbness, mildly tachycardia on presentation, sensory deficit found to left cheek and LUE between shoulder and elbow, rest of neuro exam without deficits. CT scan negative for infarct or hemorrhage. Tele Neurologist saw patient and would like patient to be admitted to Hospitalist service for complete stroke workup.   1301 - CT scan negative for infarct or hemorrhage    Spoke with Dr. Nedra Hai at 2:40 PM - agreeable to observational admission. MRI ordered with and without contrast   Jerre Simon, PA 06/17/15  1544  Eber HongBrian Miller, MD 06/18/15 1432

## 2015-06-17 NOTE — ED Notes (Signed)
Pt began having left sided facial numbness and the left side "feels weird".  Sudden onset around 11:30, some change in speech.  Pt states that it feels like she had novocaine in her mouth.  No facial asymetry, no arm drift and no weakness noted on left side

## 2015-06-18 DIAGNOSIS — F329 Major depressive disorder, single episode, unspecified: Secondary | ICD-10-CM | POA: Diagnosis not present

## 2015-06-18 DIAGNOSIS — E7800 Pure hypercholesterolemia, unspecified: Secondary | ICD-10-CM | POA: Diagnosis not present

## 2015-06-18 DIAGNOSIS — G43909 Migraine, unspecified, not intractable, without status migrainosus: Secondary | ICD-10-CM | POA: Diagnosis not present

## 2015-06-18 DIAGNOSIS — F419 Anxiety disorder, unspecified: Secondary | ICD-10-CM | POA: Diagnosis not present

## 2015-06-18 MED ORDER — SODIUM CHLORIDE 0.9 % IV BOLUS (SEPSIS)
500.0000 mL | Freq: Once | INTRAVENOUS | Status: AC
  Administered 2015-06-18: 500 mL via INTRAVENOUS

## 2015-06-18 MED ORDER — ASPIRIN 81 MG PO CHEW: 81.0000 mg | CHEWABLE_TABLET | Freq: Every day | ORAL | Status: DC

## 2015-06-18 MED ORDER — ASCORBIC ACID 500 MG PO TABS
500.0000 mg | ORAL_TABLET | Freq: Two times a day (BID) | ORAL | Status: DC
Start: 2015-06-18 — End: 2016-01-09

## 2015-06-18 MED ORDER — NICOTINE 14 MG/24HR TD PT24
14.0000 mg | MEDICATED_PATCH | Freq: Every day | TRANSDERMAL | Status: DC
Start: 2015-06-18 — End: 2016-01-09

## 2015-06-18 MED ORDER — TOPIRAMATE 25 MG PO TABS: 25.0000 mg | ORAL_TABLET | Freq: Two times a day (BID) | ORAL | Status: DC

## 2015-06-18 NOTE — Care Management Note (Signed)
Case Management Note  Patient Details  Name: Sabrina Waters MRN: [id number] Date of Birth: February 16, 1971   Pt is from home, ind with ADL's. Pt discharging home today. Pt has PCP, insurance, transportation and no difficulty obtaining medications. No CM needs.   Expected Discharge Date:  06/19/15               Expected Discharge Plan:  Home/Self Care  In-House Referral:  NA  Discharge planning Services  CM Consult  Post Acute Care Choice:  NA Choice offered to:  NA  DME Arranged:    DME Agency:     HH Arranged:    HH Agency:     Status of Service:  Completed, signed off  Medicare Important Message Given:    Date Medicare IM Given:    Medicare IM give by:    Date Additional Medicare IM Given:    Additional Medicare Important Message give by:     If discussed at Long Length of Stay Meetings, dates discussed:    Additional Comments:  Malcolm MetroChildress, Watt Geiler Demske, RN 06/18/2015, 1:36 PM

## 2015-06-18 NOTE — Progress Notes (Signed)
BP 86/52 . MD paged with low blood pressure. Orders given for NS 500cc bolus.

## 2015-06-18 NOTE — Discharge Summary (Signed)
Physician Discharge Summary  Sabrina DuhamelRobin C Waters ZOX:[id number] DOB: October 30, 1970 DOA: 06/17/2015  PCP: Cyril MourningJennifer Griffin, NP  Admit date: 06/17/2015 Discharge date: 06/18/2015  Time spent: 35 minutes  1. Follow up with PCP in one week. 2. Follow up with Dr Gerilyn Pilgrimoonquah as soon as possible.    Discharge Diagnoses:  Principal Problem:   Complicated migraine Active Problems:   Anxiety   Depression   Elevated cholesterol   Numbness of face   Discharge Condition: improved.    Diet recommendation: as tolerated.  Heart healthy.   Filed Weights   06/17/15 1608  Weight: 85.815 kg (189 lb 3 oz)    History of present illness: Patient was admitted by me for complicated migraine on June 17, 2015.  As per my H and P: " Sabrina Waters is an 45 y.o. 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 He has no nausea, vomiting, but admitted to have HA. No visual changes, and no focal weakness. There was no confusion. Work up in the ER included a negative head CT, and serology was not remarkable. She had been on Topamax before, and stopped, but did not recall any bad side effects. She does smoke about 1ppd, and drink socially. She denied drug use. ECG showed sinus tach and no acute ST T changes. Neurotelemetry was consulted by EDP and recommended admission to r/out CVA.    Hospital Course:  She was admitted into the hospital.  I suspect she has complicated migraine.  We discussed prophylactic medication, and Topamax low dose of 25mg  BID was stared, along with Vit C.  Her home meds were continued including HRT, welbutrin, and Buspar.  She was given ASA 81 mg as well.  She improved.  Her MRI showed non specific abnormality, unchanged from previous MRI 10 years ago, which could be from  migraine, ischemic changes, or MS change   (no help).  I suspect she doesn 't have MS, and will continue with complex migraine Tx.   I recommended that she quit cigarettes, and Nicotine patch was Rx for her at 14mg  per day.   She will see her PCP in follow up, and see Dr Gerilyn Pilgrimoonquah for her neuro follow up.  I advised her to avoid the tryptans.  He is anxious to go home, and will be discharge home today.  Thank you and Good Day.   Procedures:  MRI head.   Consultations:  None.   Discharge Exam: Filed Vitals:   06/18/15 0316 06/18/15 0415  BP: 108/62 102/66  Pulse: 81 79  Temp:  98.3 F (36.8 C)  Resp:      Discharge Instructions   Discharge Instructions    Diet - low sodium heart healthy    Complete by:  As directed      Increase activity slowly    Complete by:  As directed           Current Discharge Medication List    START taking these medications   Details  aspirin 81 MG chewable tablet Chew 1 tablet (81 mg total) by mouth daily. Qty: 100 tablet, Refills: 1    nicotine (NICODERM CQ - DOSED IN MG/24 HOURS) 14 mg/24hr patch Place 1 patch (14 mg total) onto the skin daily. Qty: 28 patch, Refills: 0  topiramate (TOPAMAX) 25 MG tablet Take 1 tablet (25 mg total) by mouth 2 (two) times daily. Qty: 60 tablet, Refills: 1    vitamin C (VITAMIN C) 500 MG tablet Take 1 tablet (500 mg total) by mouth 2 (two) times daily. Qty: 100 tablet, Refills: 1      CONTINUE these medications which have NOT CHANGED   Details  buPROPion (WELLBUTRIN SR) 150 MG 12 hr tablet TAKE 1 TABLET BY MOUTH TWICE DAILY Qty: 180 tablet, Refills: 0    busPIRone (BUSPAR) 15 MG tablet TAKE 1 TABLET BY MOUTH TWICE DAILY Qty: 180 tablet, Refills: 0    estradiol (ESTRACE) 1 MG tablet Take 1 tablet (1 mg total) by mouth daily. Qty: 30 tablet, Refills: 11    progesterone (PROMETRIUM) 200 MG capsule TAKE 1 CAPSULE BY MOUTH EVERY NIGHT AT BEDTIME Qty: 90 capsule, Refills: 0    simvastatin (ZOCOR) 20  MG tablet TAKE 1 TABLET BY MOUTH DAILY Qty: 90 tablet, Refills: 0      STOP taking these medications     Pseudoephedrine-Ibuprofen (ADVIL COLD/SINUS PO)        Allergies  Allergen Reactions  . Sulfa Antibiotics Nausea And Vomiting      The results of significant diagnostics from this hospitalization (including imaging, microbiology, ancillary and laboratory) are listed below for reference.    Significant Diagnostic Studies: Ct Head Wo Contrast  06/17/2015  CLINICAL DATA:  Code stroke. Sudden onset of left-sided numbness and tingling at 11:30 a.m. today. EXAM: CT HEAD WITHOUT CONTRAST TECHNIQUE: Contiguous axial images were obtained from the base of the skull through the vertex without intravenous contrast. COMPARISON:  MRI dated 09/03/2005 FINDINGS: No mass lesion. No midline shift. No acute hemorrhage or hematoma. No extra-axial fluid collections. No evidence of acute infarction. Brain parenchyma is normal. Bones are normal. IMPRESSION: Normal exam. Critical Value/emergent results were called by telephone at the time of interpretation on 06/17/2015 at 1:01 pm to Dr. Eber Hong , who verbally acknowledged these results. Electronically Signed   By: Francene Boyers M.D.   On: 06/17/2015 13:02   Mr Brain Wo Contrast  06/17/2015  CLINICAL DATA:  Complicated migraine. Left-sided numbness and weakness. EXAM: MRI HEAD WITHOUT CONTRAST TECHNIQUE: Multiplanar, multiecho pulse sequences of the brain and surrounding structures were obtained without intravenous contrast. COMPARISON:  MRI 09/03/2005 FINDINGS: Ventricle size normal.  Cerebral volume normal. Negative for acute infarct Small hyperintensity right temporoparietal lobe unchanged from the prior study. No other white matter lesions identified. Brainstem and cerebellum normal. Negative for intracranial hemorrhage.  Negative for mass or edema. Pituitary normal in size. Paranasal sinuses clear. Normal orbit. Normal skullbase. IMPRESSION: Small  periventricular white matter hyperintensity on the right is unchanged from 2007. This could be related to chronic ischemia, migraine headache or demyelinating disease. No other white matter lesions identified. No acute abnormality Electronically Signed   By: Marlan Palau M.D.   On: 06/17/2015 20:02    Microbiology: No results found for this or any previous visit (from the past 240 hour(s)).   Labs: Basic Metabolic Panel:  Recent Labs Lab 06/17/15 1230 06/17/15 1242  NA 139 141  K 3.8 3.6  CL 104 102  CO2 25  --   GLUCOSE 104* 98  BUN 11 10  CREATININE 0.74 0.70  CALCIUM 9.3  --    Liver Function Tests:  Recent Labs Lab 06/17/15 1230  AST 18  ALT 20  ALKPHOS 64  BILITOT 0.3  PROT 7.7  ALBUMIN 4.3  CBC:  Recent Labs Lab 06/17/15 1230 06/17/15 1242  WBC 13.1*  --   NEUTROABS 7.4  --   HGB 15.1* 15.3*  HCT 44.8 45.0  MCV 94.3  --   PLT 264  --     SignedHouston Siren MD.  [facility] 06/18/2015, 12:15 PM

## 2015-06-26 ENCOUNTER — Other Ambulatory Visit: Payer: Self-pay | Admitting: Adult Health

## 2015-07-01 ENCOUNTER — Other Ambulatory Visit (HOSPITAL_COMMUNITY): Payer: Self-pay | Admitting: Respiratory Therapy

## 2015-07-01 ENCOUNTER — Other Ambulatory Visit: Payer: Self-pay | Admitting: *Deleted

## 2015-07-01 DIAGNOSIS — G4733 Obstructive sleep apnea (adult) (pediatric): Secondary | ICD-10-CM

## 2015-07-01 MED ORDER — ESTRADIOL 1 MG PO TABS: 1.0000 mg | ORAL_TABLET | Freq: Every day | ORAL | Status: DC

## 2015-07-01 MED ORDER — PROGESTERONE MICRONIZED 200 MG PO CAPS: 200.0000 mg | ORAL_CAPSULE | Freq: Every day | ORAL | Status: DC

## 2015-07-01 MED ORDER — SIMVASTATIN 20 MG PO TABS: 20.0000 mg | ORAL_TABLET | Freq: Every day | ORAL | Status: DC

## 2015-08-24 ENCOUNTER — Other Ambulatory Visit: Payer: Self-pay | Admitting: Adult Health

## 2015-09-12 ENCOUNTER — Other Ambulatory Visit: Payer: Self-pay | Admitting: Adult Health

## 2015-12-29 ENCOUNTER — Other Ambulatory Visit: Payer: Self-pay | Admitting: Adult Health

## 2016-01-09 ENCOUNTER — Encounter: Payer: Self-pay | Admitting: Adult Health

## 2016-01-09 ENCOUNTER — Ambulatory Visit (INDEPENDENT_AMBULATORY_CARE_PROVIDER_SITE_OTHER): Payer: BLUE CROSS/BLUE SHIELD | Admitting: Adult Health

## 2016-01-09 ENCOUNTER — Other Ambulatory Visit (HOSPITAL_COMMUNITY)
Admission: RE | Admit: 2016-01-09 | Discharge: 2016-01-09 | Disposition: A | Discharge status: Home / Self Care | Payer: 59 | Source: Ambulatory Visit | Attending: Adult Health | Admitting: Adult Health

## 2016-01-09 VITALS — BP 132/80 | HR 66 | Ht 64.0 in | Wt 185.0 lb

## 2016-01-09 DIAGNOSIS — R635 Abnormal weight gain: Secondary | ICD-10-CM

## 2016-01-09 DIAGNOSIS — Z79890 Hormone replacement therapy (postmenopausal): Secondary | ICD-10-CM

## 2016-01-09 DIAGNOSIS — F419 Anxiety disorder, unspecified: Secondary | ICD-10-CM

## 2016-01-09 DIAGNOSIS — F329 Major depressive disorder, single episode, unspecified: Secondary | ICD-10-CM | POA: Diagnosis not present

## 2016-01-09 DIAGNOSIS — Z1151 Encounter for screening for human papillomavirus (HPV): Secondary | ICD-10-CM | POA: Insufficient documentation

## 2016-01-09 DIAGNOSIS — Z01419 Encounter for gynecological examination (general) (routine) without abnormal findings: Secondary | ICD-10-CM

## 2016-01-09 DIAGNOSIS — Z01411 Encounter for gynecological examination (general) (routine) with abnormal findings: Secondary | ICD-10-CM | POA: Diagnosis not present

## 2016-01-09 DIAGNOSIS — E7800 Pure hypercholesterolemia, unspecified: Secondary | ICD-10-CM

## 2016-01-09 DIAGNOSIS — Z1212 Encounter for screening for malignant neoplasm of rectum: Secondary | ICD-10-CM | POA: Diagnosis not present

## 2016-01-09 DIAGNOSIS — F32A Depression, unspecified: Secondary | ICD-10-CM

## 2016-01-09 LAB — HEMOCCULT GUIAC POC 1CARD (OFFICE): FECAL OCCULT BLD: NEGATIVE

## 2016-01-09 MED ORDER — BUSPIRONE HCL 15 MG PO TABS: 15.0000 mg | ORAL_TABLET | Freq: Two times a day (BID) | ORAL | 12 refills | Status: DC

## 2016-01-09 MED ORDER — PROGESTERONE MICRONIZED 200 MG PO CAPS: ORAL_CAPSULE | ORAL | 12 refills | Status: DC

## 2016-01-09 MED ORDER — BUPROPION HCL ER (SR) 150 MG PO TB12: 150.0000 mg | ORAL_TABLET | Freq: Two times a day (BID) | ORAL | 12 refills | Status: DC

## 2016-01-09 MED ORDER — ESTRADIOL 1 MG PO TABS: ORAL_TABLET | ORAL | 12 refills | Status: DC

## 2016-01-09 MED ORDER — SIMVASTATIN 20 MG PO TABS: 20.0000 mg | ORAL_TABLET | Freq: Every day | ORAL | 12 refills | Status: DC

## 2016-01-09 NOTE — Progress Notes (Signed)
Patient ID: Sabrina Waters Sabrina Waters, female   DOB: 1970/05/13, 45 y.o.   MRN: [id number] History of Present Illness: Sabrina Waters is a 45 year old whit female, divorced in for well woman gyn exam and pap.She complains of weight gain, is on HRT, and is sp ablation. She had episode in spring where had headache and left side of face was numb, was told it was migraine and was placed on Topamax by Dr Sabrina Waters but has stopped it, she says it did not help.She has changed jobs and is in [employer] now.Son graduates from [employer] in December.  PCP is Sabrina Waters.  Current Medications, Allergies, Past Medical History, Past Surgical History, Family History and Social History were reviewed in [facility].     Review of Systems:  Patient denies any daily headaches, hearing loss, fatigue, blurred vision, shortness of breath, chest pain, abdominal pain, problems with bowel movements, urination, or intercourse. No joint pain or mood swings.See HPI for positives.   Physical Exam:BP 132/80 (BP Location: Left Arm, Patient Position: Sitting, Cuff Size: Normal)   Pulse 66   Ht 5\' 4"  (1.626 m)   Wt 185 lb (83.9 kg)   BMI 31.76 kg/m  General:  Well developed, well nourished, no acute distress Skin:  Warm and dry Neck:  Midline trachea, normal thyroid, good ROM, no lymphadenopathy Lungs; Clear to auscultation bilaterally Breast:  No dominant palpable mass, retraction, or nipple discharge,has skin tags Cardiovascular: Regular rate and rhythm Abdomen:  Soft, non tender, no hepatosplenomegaly Pelvic:  External genitalia is normal in appearance, no lesions.  Sabrina vagina is normal in appearance. Urethra has no lesions or masses. Sabrina cervix is bulbous,pap with HPV performed.  Uterus is felt to be normal size, shape, and contour.  No adnexal masses or tenderness noted.Bladder is non tender, no masses felt. Rectal: Good sphincter tone, no polyps, or hemorrhoids felt.  Hemoccult negative. Extremities/musculoskeletal:   No swelling or varicosities noted, no clubbing or cyanosis Psych:  No mood changes, alert and cooperative,seems happy PHQ 9 score 6, she is on meds and is doing well, was worse during divorce. Will get labs today.  Impression:  1. Encounter for gynecological examination with Papanicolaou smear of cervix   2. Elevated cholesterol   3. Hormone replacement therapy (HRT)   4. Depression, unspecified depression type   5. Anxiety   6. Weight gain      Plan: Meds ordered this encounter  Medications  . simvastatin (ZOCOR) 20 MG tablet    Sig: Take 1 tablet (20 mg total) by mouth daily.    Dispense:  30 tablet    Refill:  12    Order Specific Question:   Supervising Provider    Answer:   Despina HiddenEURE, LUTHER H [[id number]]  . progesterone (PROMETRIUM) 200 MG capsule    Sig: TAKE 1 CAPSULE BY MOUTH AT BEDTIME    Dispense:  30 capsule    Refill:  12    Order Specific Question:   Supervising Provider    Answer:   Despina HiddenEURE, LUTHER H [[id number]]  . estradiol (ESTRACE) 1 MG tablet    Sig: TAKE 1 TABLET(1 MG) BY MOUTH DAILY    Dispense:  30 tablet    Refill:  12    Order Specific Question:   Supervising Provider    Answer:   Despina HiddenEURE, LUTHER H [[id number]]  . busPIRone (BUSPAR) 15 MG tablet    Sig: Take 1 tablet (15 mg total) by mouth 2 (two) times daily.  Dispense:  60 tablet    Refill:  12    Order Specific Question:   Supervising Provider    Answer:   Despina HiddenEURE, LUTHER H [[id number]]  . buPROPion (WELLBUTRIN SR) 150 MG 12 hr tablet    Sig: Take 1 tablet (150 mg total) by mouth 2 (two) times daily.    Dispense:  60 tablet    Refill:  12    Order Specific Question:   Supervising Provider    Answer:   Duane LopeEURE, LUTHER H [[id number]]   Check CBC,CMP,TSH and lipids,A1c and vitamin D Physical in 1 year,pap in 3 if normal Mammogram now and yearly

## 2016-01-09 NOTE — Patient Instructions (Signed)
Physical in 1 year, pap in 3 if normal Mammogram now and yearly  

## 2016-01-10 LAB — LIPID PANEL
CHOL/HDL RATIO: 5.3 ratio — AB (ref 0.0–4.4)
Cholesterol, Total: 163 mg/dL (ref 100–199)
HDL: 31 mg/dL — AB (ref >39)
LDL CALC: 94 mg/dL (ref 0–99)
Triglycerides: 190 mg/dL — ABNORMAL HIGH (ref 0–149)
VLDL CHOLESTEROL CAL: 38 mg/dL (ref 5–40)

## 2016-01-10 LAB — COMPREHENSIVE METABOLIC PANEL
ALBUMIN: 4.1 g/dL (ref 3.5–5.5)
ALK PHOS: 65 IU/L (ref 39–117)
ALT: 19 IU/L (ref 0–32)
AST: 14 IU/L (ref 0–40)
Albumin/Globulin Ratio: 1.5 (ref 1.2–2.2)
BUN / CREAT RATIO: 12 (ref 9–23)
BUN: 10 mg/dL (ref 6–24)
Bilirubin Total: 0.3 mg/dL (ref 0.0–1.2)
CO2: 23 mmol/L (ref 18–29)
CREATININE: 0.86 mg/dL (ref 0.57–1.00)
Calcium: 9.2 mg/dL (ref 8.7–10.2)
Chloride: 103 mmol/L (ref 96–106)
GFR calc non Af Amer: 82 mL/min/{1.73_m2} (ref >59)
GFR, EST AFRICAN AMERICAN: 95 mL/min/{1.73_m2} (ref >59)
GLOBULIN, TOTAL: 2.7 g/dL (ref 1.5–4.5)
GLUCOSE: 94 mg/dL (ref 65–99)
Potassium: 4.8 mmol/L (ref 3.5–5.2)
SODIUM: 141 mmol/L (ref 134–144)
TOTAL PROTEIN: 6.8 g/dL (ref 6.0–8.5)

## 2016-01-10 LAB — VITAMIN D 25 HYDROXY (VIT D DEFICIENCY, FRACTURES): Vit D, 25-Hydroxy: 39.8 ng/mL (ref 30.0–100.0)

## 2016-01-10 LAB — CBC
HEMATOCRIT: 42 % (ref 34.0–46.6)
HEMOGLOBIN: 14.3 g/dL (ref 11.1–15.9)
MCH: 30.7 pg (ref 26.6–33.0)
MCHC: 34 g/dL (ref 31.5–35.7)
MCV: 90 fL (ref 79–97)
Platelets: 214 10*3/uL (ref 150–379)
RBC: 4.66 x10E6/uL (ref 3.77–5.28)
RDW: 12.6 % (ref 12.3–15.4)
WBC: 6.9 10*3/uL (ref 3.4–10.8)

## 2016-01-10 LAB — CYTOLOGY - PAP
Diagnosis: NEGATIVE
HPV: NOT DETECTED

## 2016-01-10 LAB — HEMOGLOBIN A1C
ESTIMATED AVERAGE GLUCOSE: 117 mg/dL
HEMOGLOBIN A1C: 5.7 % — AB (ref 4.8–5.6)

## 2016-01-10 LAB — TSH: TSH: 2.04 u[IU]/mL (ref 0.450–4.500)

## 2016-04-09 ENCOUNTER — Encounter: Payer: Self-pay | Admitting: Adult Health

## 2016-06-05 ENCOUNTER — Other Ambulatory Visit (HOSPITAL_COMMUNITY): Payer: Self-pay | Admitting: Registered Nurse

## 2016-06-05 DIAGNOSIS — Z1231 Encounter for screening mammogram for malignant neoplasm of breast: Secondary | ICD-10-CM

## 2016-06-15 ENCOUNTER — Ambulatory Visit (HOSPITAL_COMMUNITY): Payer: 59

## 2016-06-15 ENCOUNTER — Ambulatory Visit (HOSPITAL_COMMUNITY)
Admission: RE | Admit: 2016-06-15 | Discharge: 2016-06-15 | Disposition: A | Discharge status: Home / Self Care | Payer: Managed Care, Other (non HMO) | Source: Ambulatory Visit | Attending: Registered Nurse | Admitting: Registered Nurse

## 2016-06-15 DIAGNOSIS — N6489 Other specified disorders of breast: Secondary | ICD-10-CM | POA: Diagnosis not present

## 2016-06-15 DIAGNOSIS — Z1231 Encounter for screening mammogram for malignant neoplasm of breast: Secondary | ICD-10-CM | POA: Insufficient documentation

## 2016-06-18 ENCOUNTER — Other Ambulatory Visit (HOSPITAL_COMMUNITY): Payer: Self-pay | Admitting: Registered Nurse

## 2016-06-18 DIAGNOSIS — R928 Other abnormal and inconclusive findings on diagnostic imaging of breast: Secondary | ICD-10-CM

## 2016-07-01 ENCOUNTER — Ambulatory Visit (HOSPITAL_COMMUNITY)
Admission: RE | Admit: 2016-07-01 | Discharge: 2016-07-01 | Disposition: A | Discharge status: Home / Self Care | Payer: Managed Care, Other (non HMO) | Source: Ambulatory Visit | Attending: Registered Nurse | Admitting: Registered Nurse

## 2016-07-01 DIAGNOSIS — R928 Other abnormal and inconclusive findings on diagnostic imaging of breast: Secondary | ICD-10-CM

## 2016-07-14 ENCOUNTER — Encounter (HOSPITAL_COMMUNITY): Payer: 59

## 2016-07-17 ENCOUNTER — Emergency Department (HOSPITAL_COMMUNITY): Payer: Managed Care, Other (non HMO)

## 2016-07-17 ENCOUNTER — Emergency Department (HOSPITAL_COMMUNITY)
Admission: EM | Admit: 2016-07-17 | Discharge: 2016-07-18 | Disposition: A | Discharge status: Home / Self Care | Payer: Managed Care, Other (non HMO) | Attending: Emergency Medicine | Admitting: Emergency Medicine

## 2016-07-17 ENCOUNTER — Encounter (HOSPITAL_COMMUNITY): Payer: Self-pay | Admitting: Emergency Medicine

## 2016-07-17 DIAGNOSIS — R51 Headache: Secondary | ICD-10-CM | POA: Insufficient documentation

## 2016-07-17 DIAGNOSIS — R109 Unspecified abdominal pain: Secondary | ICD-10-CM | POA: Insufficient documentation

## 2016-07-17 DIAGNOSIS — F17210 Nicotine dependence, cigarettes, uncomplicated: Secondary | ICD-10-CM | POA: Diagnosis not present

## 2016-07-17 DIAGNOSIS — Z7982 Long term (current) use of aspirin: Secondary | ICD-10-CM | POA: Insufficient documentation

## 2016-07-17 DIAGNOSIS — R072 Precordial pain: Secondary | ICD-10-CM | POA: Diagnosis present

## 2016-07-17 DIAGNOSIS — M542 Cervicalgia: Secondary | ICD-10-CM | POA: Insufficient documentation

## 2016-07-17 DIAGNOSIS — Z79899 Other long term (current) drug therapy: Secondary | ICD-10-CM | POA: Diagnosis not present

## 2016-07-17 LAB — I-STAT TROPONIN, ED: TROPONIN I, POC: 0 ng/mL (ref 0.00–0.08)

## 2016-07-17 LAB — CBC
HEMATOCRIT: 42.6 % (ref 36.0–46.0)
HEMOGLOBIN: 14.3 g/dL (ref 12.0–15.0)
MCH: 31.2 pg (ref 26.0–34.0)
MCHC: 33.6 g/dL (ref 30.0–36.0)
MCV: 93 fL (ref 78.0–100.0)
Platelets: 246 10*3/uL (ref 150–400)
RBC: 4.58 MIL/uL (ref 3.87–5.11)
RDW: 12.1 % (ref 11.5–15.5)
WBC: 10.2 10*3/uL (ref 4.0–10.5)

## 2016-07-17 LAB — BASIC METABOLIC PANEL
ANION GAP: 6 (ref 5–15)
BUN: 13 mg/dL (ref 6–20)
CALCIUM: 9.3 mg/dL (ref 8.9–10.3)
CO2: 26 mmol/L (ref 22–32)
Chloride: 106 mmol/L (ref 101–111)
Creatinine, Ser: 0.81 mg/dL (ref 0.44–1.00)
Glucose, Bld: 104 mg/dL — ABNORMAL HIGH (ref 65–99)
POTASSIUM: 3.9 mmol/L (ref 3.5–5.1)
SODIUM: 138 mmol/L (ref 135–145)

## 2016-07-17 LAB — HEPATIC FUNCTION PANEL
ALT: 22 U/L (ref 14–54)
AST: 17 U/L (ref 15–41)
Albumin: 3.9 g/dL (ref 3.5–5.0)
Alkaline Phosphatase: 52 U/L (ref 38–126)
TOTAL PROTEIN: 7 g/dL (ref 6.5–8.1)
Total Bilirubin: 0.2 mg/dL — ABNORMAL LOW (ref 0.3–1.2)

## 2016-07-17 LAB — LIPASE, BLOOD: Lipase: 39 U/L (ref 11–51)

## 2016-07-17 NOTE — ED Triage Notes (Signed)
Pt states that she has been having chest pain in the center of her chest that radiates into her neck and left arm since last night, sometimes difficult to catch breath, sweating, nausea

## 2016-07-17 NOTE — ED Provider Notes (Signed)
[facility] DEPT Provider Note   CSN: [id number] Arrival date & time: 07/17/16  2217  By signing my name below, I, Nelwyn SalisburyJoshua Fowler, attest that this documentation has been prepared under the direction and in the presence of Zadie RhineWickline, Sriya Kroeze, MD . Electronically Signed: Nelwyn SalisburyJoshua Fowler, Scribe. 07/17/2016. 11:17 PM.  History   Chief Complaint Chief Complaint  Patient presents with  . Chest Pain   The history is provided by the patient. No language interpreter was used.  Chest Pain   This is a new problem. The current episode started yesterday. The problem has not changed since onset.The pain is present in the substernal region. The pain is mild. The pain radiates to the left neck and left arm. Duration of episode(s) is 7 minutes. Associated symptoms include abdominal pain and headaches. Pertinent negatives include no shortness of breath. She has tried nothing for the symptoms. The treatment provided no relief. Risk factors include smoking/tobacco exposure and hormone replacement therapy.    HPI Comments:  Sabrina Waters is a [age] y.o. female who presents to the Emergency Department complaining of sudden-onset, intermittent, mild chest pain beginning yesterday. Pt states that her episodes of chest pain last several minutes, begin in the center of her chest, and radiate into her left arm and the left side of her neck. She reports associated mild abdominal pain and gradually resolving headache which began yesterday. Pt has tried an antacid at home with minimal relief and states that her symptoms feel similar but not identical to some acid reflux symptoms she has experienced in the past. Denies any SOB or LE swelling. Pt is an everyday smoker with a limited family history of heart issues.  (parents/sibilings without known CAD) S/p cholecystectomy Past Medical History:  Diagnosis Date  . Anxiety   . Depression   . Elevated cholesterol   . Hormone replacement therapy (HRT)   . RUQ pain 01/11/2013     Patient Active Problem List   Diagnosis Date Noted  . Numbness of face 06/17/2015  . 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 Hormone replacement therapy (HRT) 01/11/2013  . RUQ pain 01/11/2013  . Anxiety 09/12/2012  . Depression 09/12/2012  . Elevated cholesterol 09/12/2012  . Fatigue 09/12/2012    Past Surgical History:  Procedure Laterality Date  . ENDOMETRIAL ABLATION      OB History    Gravida Para Term Preterm AB Living   1 1       1    SAB TAB Ectopic Multiple Live Births           1       Home Medications    Prior to Admission medications   Medication Sig Start Date End Date Taking? Authorizing Provider  aspirin 81 MG chewable tablet Chew 1 tablet (81 mg total) by mouth daily. 06/18/15   Houston SirenLe, Peter, MD  buPROPion (WELLBUTRIN SR) 150 MG 12 hr tablet Take 1 tablet (150 mg total) by mouth 2 (two) times daily. 01/09/16   Adline PotterGriffin, Jennifer A, NP  busPIRone (BUSPAR) 15 MG tablet Take 1 tablet (15 mg total) by mouth 2 (two) times daily. 01/09/16   Adline PotterGriffin, Jennifer A, NP  estradiol (ESTRACE) 1 MG tablet TAKE 1 TABLET(1 MG) BY MOUTH DAILY 01/09/16   Adline PotterGriffin, Jennifer A, NP  progesterone (PROMETRIUM) 200 MG capsule TAKE 1 CAPSULE BY MOUTH AT BEDTIME 01/09/16   Adline PotterGriffin, Jennifer A, NP  simvastatin (ZOCOR) 20 MG tablet Take 1 tablet (20 mg total) by mouth daily. 01/09/16   Adline PotterGriffin, Jennifer A,  NP    Family History Family History  Problem Relation Age of Onset  . Hypertension Mother   . Mental illness Sister   . Hypertension Brother     Social History Social History  Substance Use Topics  . Smoking status: Current Every Day Smoker    Packs/day: 0.50    Years: 30.00    Types: Cigarettes  . Smokeless tobacco: Never Used  . Alcohol use Yes     Comment: socially     Allergies   Sulfa antibiotics   Review of Systems Review of Systems  Respiratory: Negative for shortness of breath.   Cardiovascular: Positive for chest pain. Negative for leg swelling.   Gastrointestinal: Positive for abdominal pain.  Musculoskeletal: Positive for myalgias and neck pain.  Neurological: Positive for headaches.  All other systems reviewed and are negative.    Physical Exam Updated Vital Signs BP 134/89 (BP Location: Right Arm)   Pulse 93   Temp 98.4 F (36.9 C) (Oral)   Resp 15   Ht 5\' 4"  (1.626 m)   Wt 185 lb (83.9 kg)   SpO2 99%   BMI 31.76 kg/m   Physical Exam CONSTITUTIONAL: Well developed/well nourished HEAD: Normocephalic/atraumatic EYES: EOMI/PERRL ENMT: Mucous membranes moist NECK: supple no meningeal signs SPINE/BACK:entire spine nontender CV: S1/S2 noted, no murmurs/rubs/gallops noted LUNGS: Lungs are clear to auscultation bilaterally, no apparent distress ABDOMEN: soft, nontender, no rebound or guarding, bowel sounds noted throughout abdomen GU:no cva tenderness NEURO: Pt is awake/alert/appropriate, moves all extremitiesx4.  No facial droop.   EXTREMITIES: pulses normal/equal, full ROM. No calf tenderness or edema.  SKIN: warm, color normal PSYCH: no abnormalities of mood noted, alert and oriented to situation   ED Treatments / Results  DIAGNOSTIC STUDIES:  Oxygen Saturation is 99% on RA, normal by my interpretation.    COORDINATION OF CARE:  11:23 PM Discussed treatment plan with pt at bedside which includes blood work and pt agreed to plan.  Labs (all labs ordered are listed, but only abnormal results are displayed) Labs Reviewed  BASIC METABOLIC PANEL - Abnormal; Notable for the following:       Result Value   Glucose, Bld 104 (*)    All other components within normal limits  HEPATIC FUNCTION PANEL - Abnormal; Notable for the following:    Total Bilirubin 0.2 (*)    Bilirubin, Direct <0.1 (*)    All other components within normal limits  CBC  LIPASE, BLOOD  I-STAT TROPOININ, ED  I-STAT TROPOININ, ED    EKG  EKG Interpretation  Date/Time:  Friday Jul 17 2016 22:25:22 EDT Ventricular Rate:  94 PR  Interval:    QRS Duration: 82 QT Interval:  353 QTC Calculation: 442 R Axis:   46 Text Interpretation:  Sinus rhythm Low voltage, precordial leads no acute changes  Confirmed by LIU MD, DANA [phone]) on 07/17/2016 10:49:44 PM       Radiology Dg Chest 2 View  Result Date: 07/17/2016 CLINICAL DATA:  Chest pain and migraines.  Dyspnea. EXAM: CHEST  2 VIEW COMPARISON:  Report from 08/22/2010 FINDINGS: The heart size and mediastinal contours are within normal limits. Both lungs are clear. The visualized skeletal structures are unremarkable. IMPRESSION: No active cardiopulmonary disease. Electronically Signed   By: Tollie Eth M.D.   On: 07/17/2016 23:24    Procedures Procedures  Medications Ordered in ED Medications - No data to display   Initial Impression / Assessment and Plan / ED Course  I have reviewed the  triage vital signs and the nursing notes.  Pertinent labs & imaging results that were available during my care of the patient were reviewed by me and considered in my medical decision making (see chart for details).     11:52 PM Pt well appearing No distress/watching TV No active CP HEART score - 3 Will recheck troponin at 2am Pt agreeable Of note - she is on HRT and still smoking  - I advised need to quit smoking immediately and/or get off HRT.  She has GYN f/u next week Also - no pleuritic CP reported.  No hypoxia.  This history does not support PE.  I have low suspicion for Dissection at this time 2:12 AM Pt stable No acute issues Vitals appropriate Repeat troponin negative Will d/c home We discussed strict ER return precautions  Final Clinical Impressions(s) / ED Diagnoses   Final diagnoses:  Precordial pain    New Prescriptions New Prescriptions   No medications on file  I personally performed the services described in this documentation, which was scribed in my presence. The recorded information has been reviewed and is accurate.        Zadie Rhine, MD 07/18/16 [phone]

## 2016-07-17 NOTE — ED Notes (Signed)
ED Provider at bedside. 

## 2016-07-18 LAB — I-STAT TROPONIN, ED: TROPONIN I, POC: 0 ng/mL (ref 0.00–0.08)

## 2016-07-18 NOTE — Discharge Instructions (Signed)

## 2016-07-22 ENCOUNTER — Ambulatory Visit (INDEPENDENT_AMBULATORY_CARE_PROVIDER_SITE_OTHER): Payer: Managed Care, Other (non HMO) | Admitting: Adult Health

## 2016-07-22 ENCOUNTER — Encounter: Payer: Self-pay | Admitting: Adult Health

## 2016-07-22 VITALS — BP 118/72 | HR 107 | Ht 64.0 in | Wt 188.5 lb

## 2016-07-22 DIAGNOSIS — J0140 Acute pansinusitis, unspecified: Secondary | ICD-10-CM

## 2016-07-22 DIAGNOSIS — G4489 Other headache syndrome: Secondary | ICD-10-CM

## 2016-07-22 DIAGNOSIS — R0683 Snoring: Secondary | ICD-10-CM | POA: Diagnosis not present

## 2016-07-22 DIAGNOSIS — R5383 Other fatigue: Secondary | ICD-10-CM | POA: Diagnosis not present

## 2016-07-22 MED ORDER — AMOXICILLIN-POT CLAVULANATE 875-125 MG PO TABS: 1.0000 | ORAL_TABLET | Freq: Two times a day (BID) | ORAL | 0 refills | Status: DC

## 2016-07-22 NOTE — Progress Notes (Signed)
Subjective:     Patient ID: Sabrina Waters, female   DOB: Aug 26, 1970, 46 y.o.   MRN: [id number]  HPI Sabrina Waters is a 46 year old white female in complaining of headache almost daily, has been on Flonase since January.Feels tired and partner says she snores and moves all night.Has Dr Sabrina Pilgrimoonquah in past.  Review of Systems Headache daily  Tired  +snores Reviewed past medical,surgical, social and family history. Reviewed medications and allergies.     Objective:   Physical Exam BP 118/72 (BP Location: Left Arm, Patient Position: Sitting, Cuff Size: Normal)   Pulse (!) 107   Ht 5\' 4"  (1.626 m)   Wt 188 lb 8 oz (85.5 kg)   BMI 32.36 kg/m  Skin warm and dry. Lungs: clear to ausculation bilaterally. Cardiovascular: regular rate and rhythm.   Has + tenderness frontal, maxillary and ethmoid sinus.   Assessment:     1. Subacute pansinusitis   2. Other headache syndrome   3. Tired   4. Snores       Plan:     Rx Augmentin 875-125 mg #20 take 1 bid  Stop Flonase F/U 6/6, if not better, will refer to ENT and get sleep study

## 2016-08-05 ENCOUNTER — Encounter: Payer: Self-pay | Admitting: Adult Health

## 2016-08-05 ENCOUNTER — Ambulatory Visit (INDEPENDENT_AMBULATORY_CARE_PROVIDER_SITE_OTHER): Payer: Managed Care, Other (non HMO) | Admitting: Adult Health

## 2016-08-05 VITALS — BP 118/62 | HR 90 | Ht 64.0 in | Wt 190.5 lb

## 2016-08-05 DIAGNOSIS — R5383 Other fatigue: Secondary | ICD-10-CM | POA: Diagnosis not present

## 2016-08-05 DIAGNOSIS — R0683 Snoring: Secondary | ICD-10-CM

## 2016-08-05 DIAGNOSIS — G4489 Other headache syndrome: Secondary | ICD-10-CM | POA: Diagnosis not present

## 2016-08-05 NOTE — Progress Notes (Signed)
Subjective:     Patient ID: Sabrina Waters, female   DOB: 1970/10/29, 46 y.o.   MRN: [id number]  HPI Sabrina Waters is a a 46 year old white female, back in follow up of being treated for sinusitis, is better but still with dull headache in frontal area.  Review of Systems Dull headache now Tired,snores Reviewed past medical,surgical, social and family history. Reviewed medications and allergies.     Objective:   Physical Exam BP 118/62 (BP Location: Left Arm, Patient Position: Sitting, Cuff Size: Normal)   Pulse 90   Ht 5\' 4"  (1.626 m)   Wt 190 lb 8 oz (86.4 kg)   BMI 32.70 kg/m Still tender over frontal sinus, just pressure over maxillary sinus now.   Will refer to ENT and get sleep study  Assessment:     1. Other headache syndrome   2. Snores   3.       Tired     Plan:     Will refer to ENT and schedule sleep study Follow up prn

## 2016-08-11 ENCOUNTER — Encounter: Payer: Self-pay | Admitting: Adult Health

## 2016-08-21 ENCOUNTER — Telehealth: Payer: Self-pay | Admitting: Adult Health

## 2016-08-21 DIAGNOSIS — R0683 Snoring: Secondary | ICD-10-CM

## 2016-08-21 DIAGNOSIS — R5383 Other fatigue: Secondary | ICD-10-CM

## 2016-08-21 NOTE — Telephone Encounter (Signed)
[name] from the [facility] called stating that patient insurance [employer] does not cover for her to do the sleep study in office, but they do cover it for her to do it at home. They need another referral stating she could have it at home. Please contact [name] @ [phone]

## 2016-08-25 NOTE — Telephone Encounter (Signed)
[name] from the [facility] called stating that patient insurance [employer]rina [employer] does not cover for her to do the sleep study in office, but they do cover it for her to do it at home. They need another referral stating she could have it at home

## 2016-08-26 NOTE — Telephone Encounter (Signed)
Sleep study at home ordered

## 2016-09-01 ENCOUNTER — Telehealth: Payer: Self-pay | Admitting: Adult Health

## 2016-09-01 NOTE — Telephone Encounter (Signed)
Pt doing at home sleep study 09/04/16, and she is aware that I go insurance approval

## 2016-09-14 ENCOUNTER — Ambulatory Visit (INDEPENDENT_AMBULATORY_CARE_PROVIDER_SITE_OTHER): Payer: Managed Care, Other (non HMO) | Admitting: Otolaryngology

## 2016-09-14 DIAGNOSIS — J320 Chronic maxillary sinusitis: Secondary | ICD-10-CM

## 2016-09-14 DIAGNOSIS — J310 Chronic rhinitis: Secondary | ICD-10-CM | POA: Diagnosis not present

## 2016-09-14 DIAGNOSIS — R51 Headache: Secondary | ICD-10-CM | POA: Diagnosis not present

## 2016-09-15 ENCOUNTER — Other Ambulatory Visit (INDEPENDENT_AMBULATORY_CARE_PROVIDER_SITE_OTHER): Payer: Self-pay | Admitting: Otolaryngology

## 2016-09-15 DIAGNOSIS — J320 Chronic maxillary sinusitis: Secondary | ICD-10-CM

## 2016-09-16 ENCOUNTER — Ambulatory Visit: Payer: Managed Care, Other (non HMO) | Attending: Adult Health | Admitting: Neurology

## 2016-09-16 DIAGNOSIS — Z79899 Other long term (current) drug therapy: Secondary | ICD-10-CM | POA: Insufficient documentation

## 2016-09-16 DIAGNOSIS — G4733 Obstructive sleep apnea (adult) (pediatric): Secondary | ICD-10-CM | POA: Diagnosis not present

## 2016-09-16 DIAGNOSIS — R5383 Other fatigue: Secondary | ICD-10-CM | POA: Insufficient documentation

## 2016-09-16 DIAGNOSIS — R0683 Snoring: Secondary | ICD-10-CM

## 2016-09-21 ENCOUNTER — Ambulatory Visit (HOSPITAL_COMMUNITY)
Admission: RE | Admit: 2016-09-21 | Discharge: 2016-09-21 | Disposition: A | Discharge status: Home / Self Care | Payer: Managed Care, Other (non HMO) | Source: Ambulatory Visit | Attending: Otolaryngology | Admitting: Otolaryngology

## 2016-09-21 DIAGNOSIS — J342 Deviated nasal septum: Secondary | ICD-10-CM | POA: Diagnosis not present

## 2016-09-21 DIAGNOSIS — J343 Hypertrophy of nasal turbinates: Secondary | ICD-10-CM | POA: Insufficient documentation

## 2016-09-21 DIAGNOSIS — J320 Chronic maxillary sinusitis: Secondary | ICD-10-CM | POA: Diagnosis present

## 2016-09-25 NOTE — Procedures (Signed)
  [facility] Nataliyah Packham A. Gerilyn Pilgrimoonquah, MD     [email or web address]             HOME SLEEP TEST  LOCATION: [facility]   Patient Name: Sabrina Waters, Sabrina Waters Study Date: 09/16/2016 Gender: Female D.O.B: 24-Mar-1970 Age (years): 45 Referring Provider: Adline PotterJennifer A Griffin NP Height (inches): 64 Interpreting Physician: Beryle BeamsKofi Bethanne Mule MD, ABSM Weight (lbs): 190 RPSGT: Peak, Robert BMI: 33 MRN: [id number] Neck Size: CLINICAL INFORMATION Sleep Study Type: HST  Indication for sleep study: Fatigue, Snoring  Epworth Sleepiness Score: NA  SLEEP STUDY TECHNIQUE A multi-channel overnight portable sleep study was performed. The channels recorded were: nasal airflow, thoracic respiratory movement, and oxygen saturation with a pulse oximetry. Snoring was also monitored.  MEDICATIONS Patient self administered medications include: N/A.  Current Outpatient Prescriptions:  .  buPROPion (WELLBUTRIN SR) 150 MG 12 hr tablet, Take 1 tablet (150 mg total) by mouth 2 (two) times daily., Disp: 60 tablet, Rfl: 12 .  busPIRone (BUSPAR) 15 MG tablet, Take 1 tablet (15 mg total) by mouth 2 (two) times daily., Disp: 60 tablet, Rfl: 12 .  estradiol (ESTRACE) 1 MG tablet, TAKE 1 TABLET(1 MG) BY MOUTH DAILY, Disp: 30 tablet, Rfl: 12 .  ibuprofen (ADVIL,MOTRIN) 200 MG tablet, Take 400 mg by mouth as needed., Disp: , Rfl:  .  progesterone (PROMETRIUM) 200 MG capsule, TAKE 1 CAPSULE BY MOUTH AT BEDTIME, Disp: 30 capsule, Rfl: 12 .  simvastatin (ZOCOR) 20 MG tablet, Take 1 tablet (20 mg total) by mouth daily., Disp: 30 tablet, Rfl: 12   SLEEP ARCHITECTURE Patient was studied for 462.3 minutes. The sleep efficiency was 96.6 % and the patient was supine for 59.1%. The arousal index was 0.0 per hour.  RESPIRATORY PARAMETERS The overall AHI was 10.3 per hour, with a central apnea index of 0.0 per hour.  The oxygen nadir was 79% during sleep.  CARDIAC DATA Mean heart rate during sleep was 95.7 bpm.  IMPRESSIONS -  Mild to moderate  obstructive sleep apnea occurred during this study (AHI = 10.3/h). A formal CPAP titration study is suggested.  Argie RammingKofi A Oluwaseun Cremer, MD Diplomate, American Board of Sleep Medicine.   ELECTRONICALLY SIGNED ON:  09/25/2016, 4:15 PM [facility] PH: [phone]   FX: [phone] ACCREDITED BY THE AMERICAN ACADEMY OF SLEEP MEDICINE

## 2016-09-29 ENCOUNTER — Encounter: Payer: Self-pay | Admitting: Adult Health

## 2016-09-29 ENCOUNTER — Telehealth: Payer: Self-pay | Admitting: Adult Health

## 2016-09-29 DIAGNOSIS — G4730 Sleep apnea, unspecified: Secondary | ICD-10-CM | POA: Insufficient documentation

## 2016-09-29 DIAGNOSIS — G4733 Obstructive sleep apnea (adult) (pediatric): Secondary | ICD-10-CM

## 2016-09-29 HISTORY — DX: Sleep apnea, unspecified: G47.30

## 2016-09-29 NOTE — Telephone Encounter (Signed)
Pt had CT 7/23 and sees Dr Suszanne Connerseoh in August, she is aware of sleep study and wil lcall Dr Gerilyn Pilgrimoonquah for appt to Follow up with him and get  CPAP study scheduled.

## 2016-09-29 NOTE — Telephone Encounter (Signed)
Left mesage to call me about sleep study

## 2016-10-22 ENCOUNTER — Encounter: Payer: Self-pay | Admitting: Adult Health

## 2016-10-22 ENCOUNTER — Ambulatory Visit (INDEPENDENT_AMBULATORY_CARE_PROVIDER_SITE_OTHER): Payer: Managed Care, Other (non HMO) | Admitting: Otolaryngology

## 2016-10-22 DIAGNOSIS — J342 Deviated nasal septum: Secondary | ICD-10-CM

## 2016-10-22 DIAGNOSIS — J310 Chronic rhinitis: Secondary | ICD-10-CM

## 2016-10-22 DIAGNOSIS — J343 Hypertrophy of nasal turbinates: Secondary | ICD-10-CM | POA: Diagnosis not present

## 2016-10-23 ENCOUNTER — Telehealth: Payer: Self-pay | Admitting: Adult Health

## 2016-10-23 NOTE — Telephone Encounter (Signed)
Patient called stating that she would like Sabrina Waters to refer her to a neurologist. Please contact pt

## 2016-10-23 NOTE — Telephone Encounter (Signed)
Has appt with Dr Gerilyn Pilgrim

## 2017-01-24 ENCOUNTER — Other Ambulatory Visit: Payer: Self-pay | Admitting: Adult Health

## 2017-05-22 IMAGING — CT CT HEAD W/O CM
1 series · 16 of 30 positions shown, 20 images · non-contrast
Comparison: MRI dated 09/03/2005

CLINICAL DATA: Code stroke. Sudden onset of left-sided numbness and
tingling at [DATE] a.m. today.

EXAM:
CT HEAD WITHOUT CONTRAST
TECHNIQUE: Contiguous axial images were obtained from the base of the skull
through the vertex without intravenous contrast.

[Series 2: headtrauma 4.8 h37s · axial · 0.42mm/px · z∈[+1074,+1229]mm · 16 of 36 slices shown, 20 images]
[im 2/36  brain]
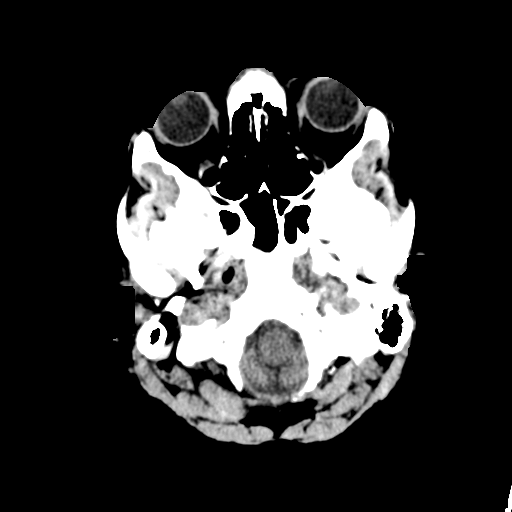
[im 2/36  bone]
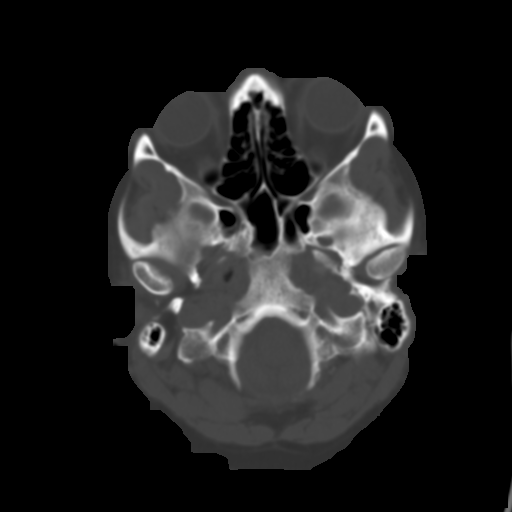
[im 4/36  brain]
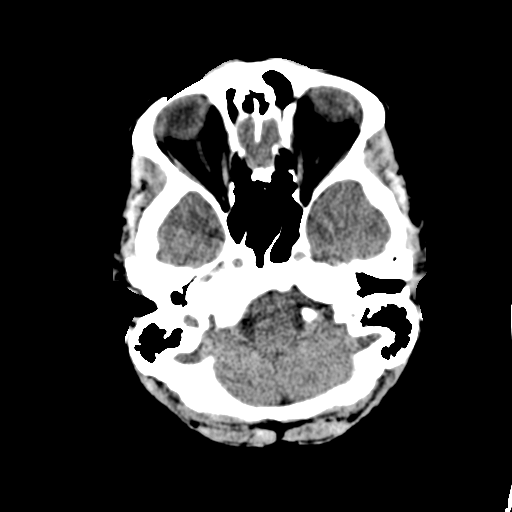
[im 7/36  brain]
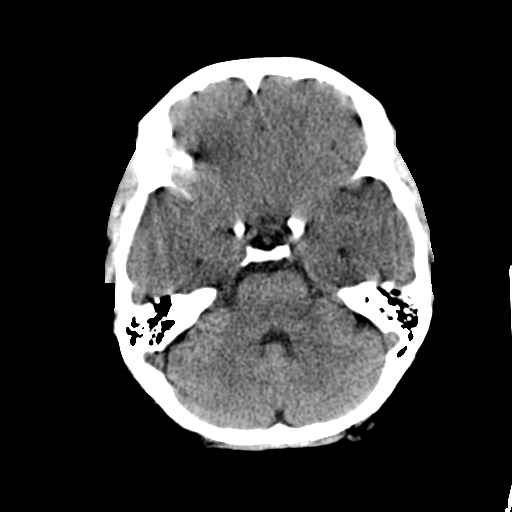
[im 9/36  brain]
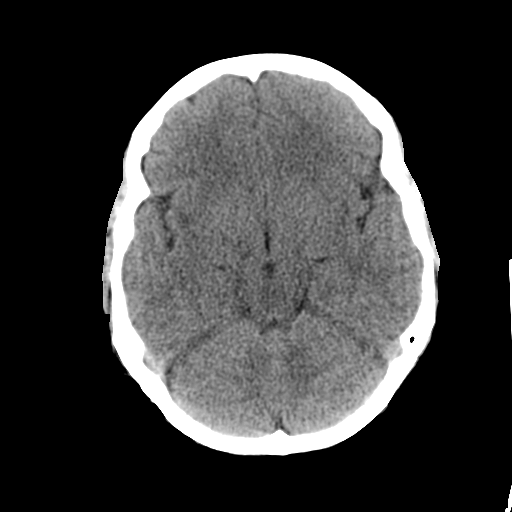
[im 10/36  brain]
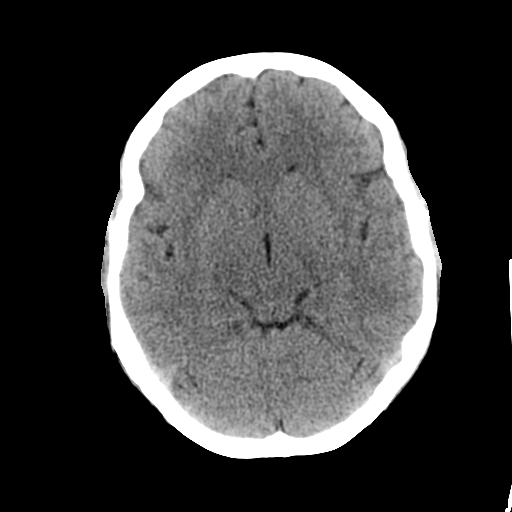
[im 10/36  bone]
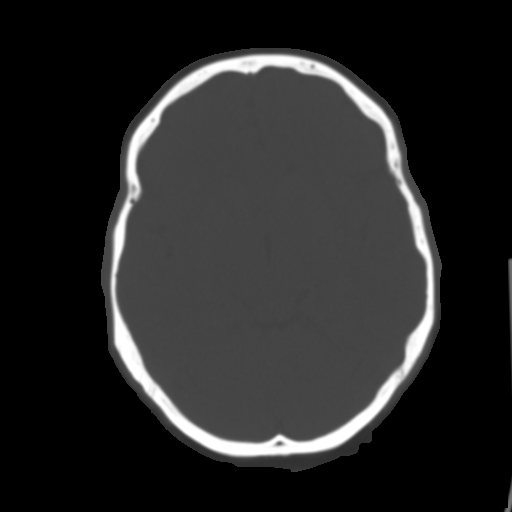
[im 13/36  brain]
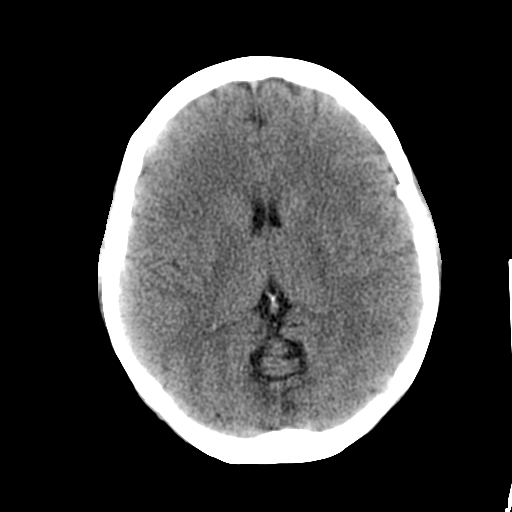
[im 15/36  brain]
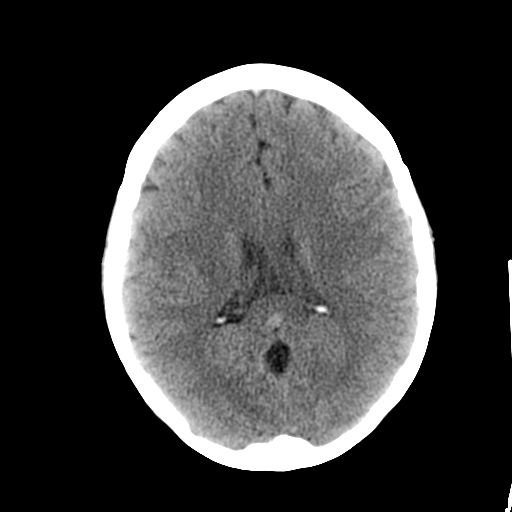
[im 17/36  brain]
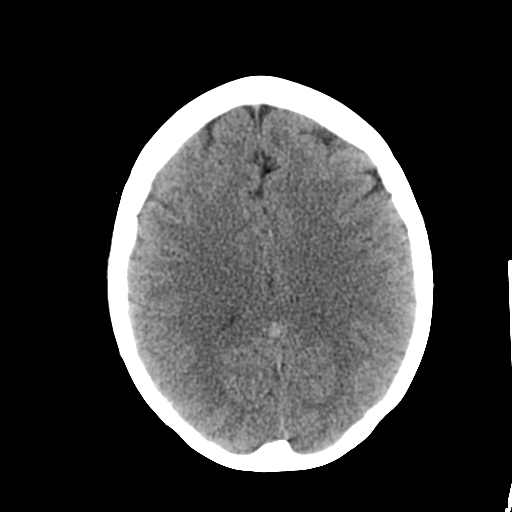
[im 19/36  brain]
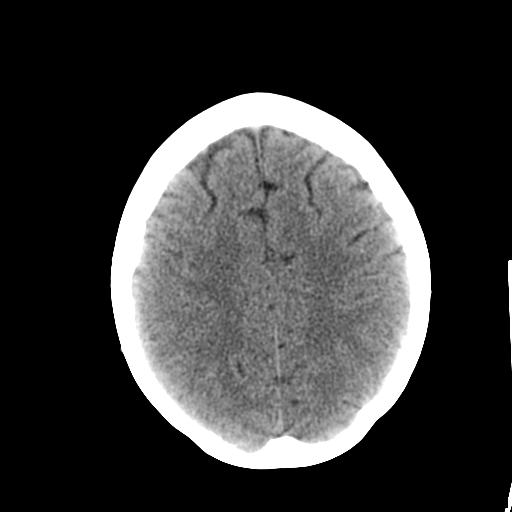
[im 19/36  bone]
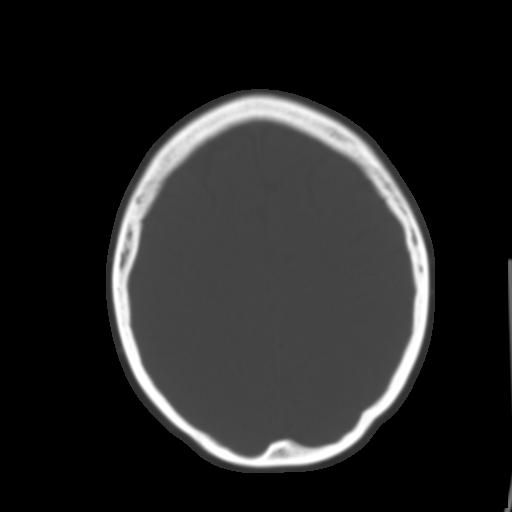
[im 21/36  brain]
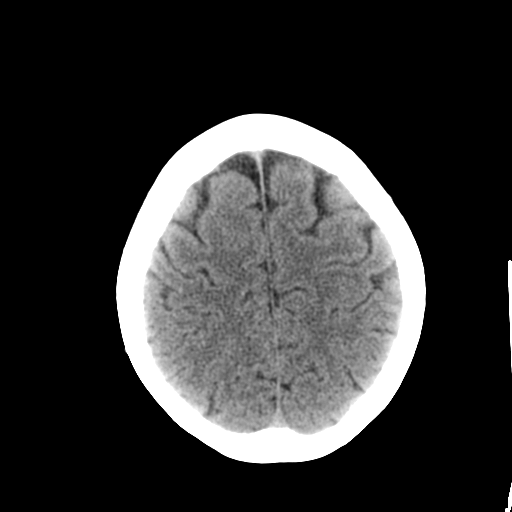
[im 23/36  brain]
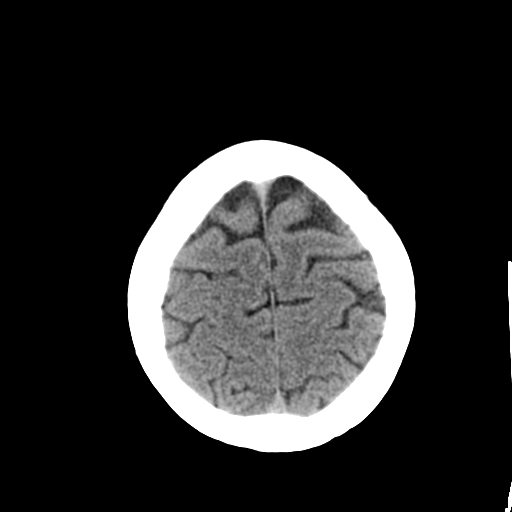
[im 26/36  brain]
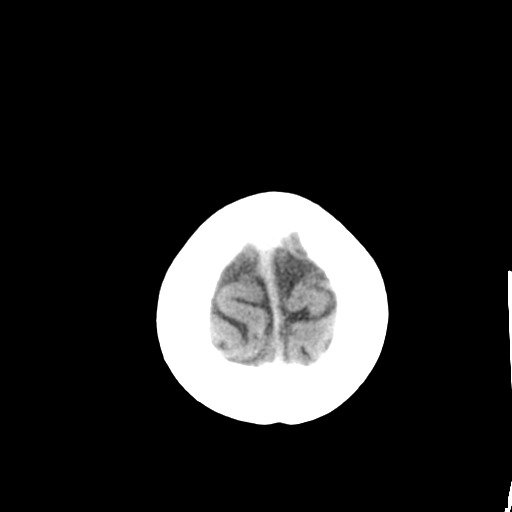
[im 27/36  brain]
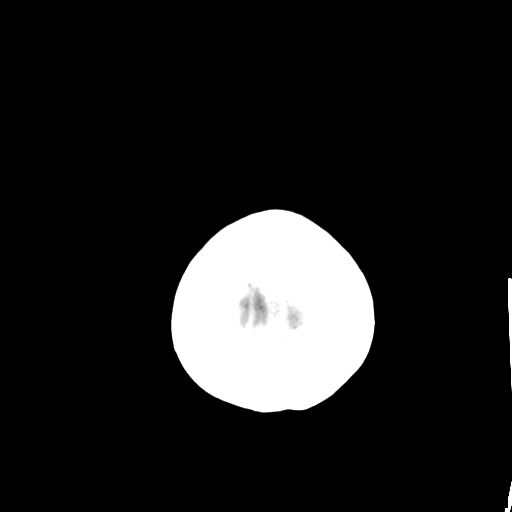
[im 27/36  bone]
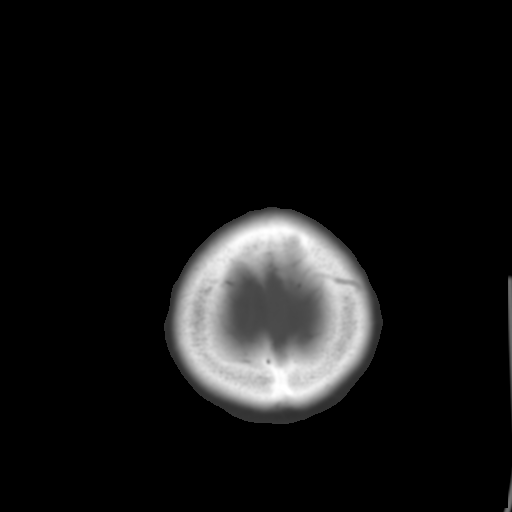
[im 29/36  brain]
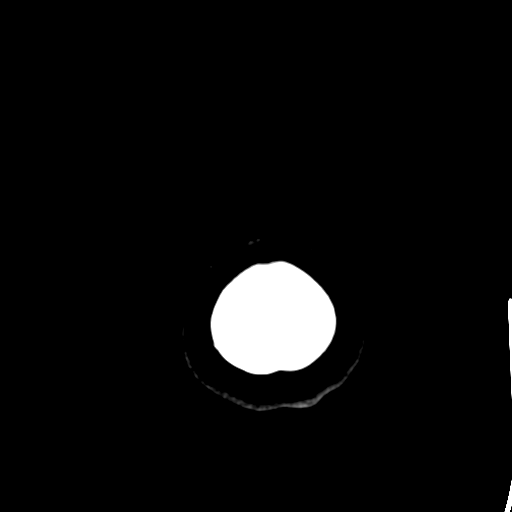
[im 32/36  brain]
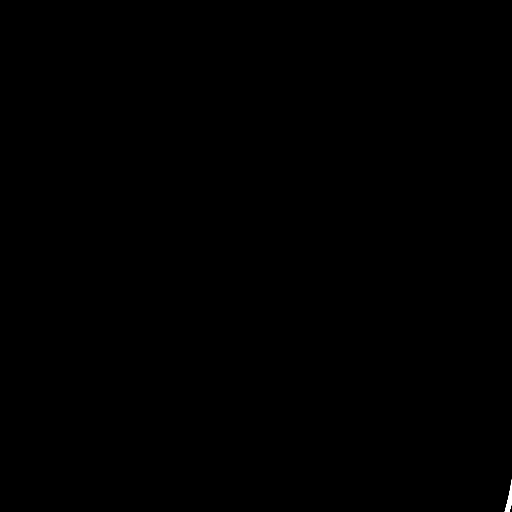
[im 34/36  brain]
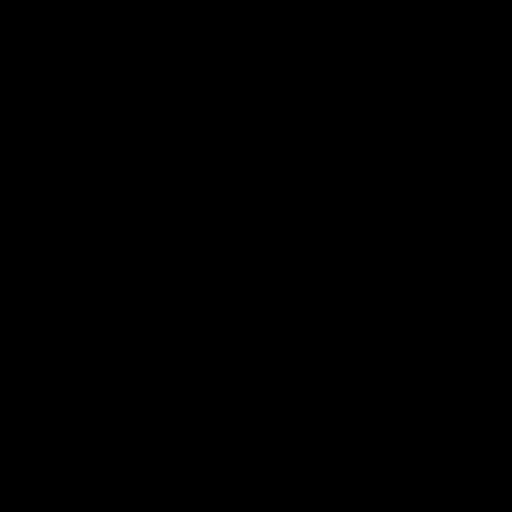

[16 of 30 positions shown; findings below may reference images not displayed]

FINDINGS: No mass lesion. No midline shift. No acute hemorrhage or hematoma.
No extra-axial fluid collections. No evidence of acute infarction.
Brain parenchyma is normal. Bones are normal.
IMPRESSION: Normal exam. Critical Value/emergent results were called by
telephone at the time of interpretation on 06/17/2015 at [DATE] to
Dr. ELODIA HAYE , who verbally acknowledged these results.

## 2017-06-29 ENCOUNTER — Ambulatory Visit (INDEPENDENT_AMBULATORY_CARE_PROVIDER_SITE_OTHER): Payer: Managed Care, Other (non HMO) | Admitting: Adult Health

## 2017-06-29 ENCOUNTER — Encounter: Payer: Self-pay | Admitting: Adult Health

## 2017-06-29 VITALS — BP 124/70 | HR 108 | Ht 64.0 in | Wt 191.5 lb

## 2017-06-29 DIAGNOSIS — Z1212 Encounter for screening for malignant neoplasm of rectum: Secondary | ICD-10-CM | POA: Diagnosis not present

## 2017-06-29 DIAGNOSIS — Z79890 Hormone replacement therapy (postmenopausal): Secondary | ICD-10-CM

## 2017-06-29 DIAGNOSIS — R52 Pain, unspecified: Secondary | ICD-10-CM | POA: Diagnosis not present

## 2017-06-29 DIAGNOSIS — R7309 Other abnormal glucose: Secondary | ICD-10-CM | POA: Diagnosis not present

## 2017-06-29 DIAGNOSIS — F32A Depression, unspecified: Secondary | ICD-10-CM

## 2017-06-29 DIAGNOSIS — Z1211 Encounter for screening for malignant neoplasm of colon: Secondary | ICD-10-CM | POA: Diagnosis not present

## 2017-06-29 DIAGNOSIS — R5383 Other fatigue: Secondary | ICD-10-CM

## 2017-06-29 DIAGNOSIS — E7800 Pure hypercholesterolemia, unspecified: Secondary | ICD-10-CM | POA: Diagnosis not present

## 2017-06-29 DIAGNOSIS — F419 Anxiety disorder, unspecified: Secondary | ICD-10-CM

## 2017-06-29 DIAGNOSIS — F418 Other specified anxiety disorders: Secondary | ICD-10-CM

## 2017-06-29 DIAGNOSIS — Z01419 Encounter for gynecological examination (general) (routine) without abnormal findings: Secondary | ICD-10-CM | POA: Insufficient documentation

## 2017-06-29 DIAGNOSIS — Z01411 Encounter for gynecological examination (general) (routine) with abnormal findings: Secondary | ICD-10-CM | POA: Diagnosis not present

## 2017-06-29 DIAGNOSIS — F329 Major depressive disorder, single episode, unspecified: Secondary | ICD-10-CM

## 2017-06-29 HISTORY — DX: Other abnormal glucose: R73.09

## 2017-06-29 LAB — HEMOCCULT GUIAC POC 1CARD (OFFICE): Fecal Occult Blood, POC: NEGATIVE

## 2017-06-29 MED ORDER — DULOXETINE HCL 30 MG PO CPEP: 30.0000 mg | ORAL_CAPSULE | Freq: Every day | ORAL | 3 refills | Status: DC

## 2017-06-29 NOTE — Progress Notes (Signed)
Patient ID: Sabrina Waters, female   DOB: 22-Oct-1970, 47 y.o.   MRN: [id number] History of Present Illness: Sabrina Waters is a 47 year old white female, G1P1, sp ablation in for well woman gyn exam, she had a normal pap with negative HPV 01/09/16. PCP is [place].   Current Medications, Allergies, Past Medical History, Past Surgical History, Family History and Social History were reviewed in [employer] record.     Review of Systems: Patient denies any headaches, hearing loss,blurred vision, shortness of breath, chest pain, abdominal pain, problems with bowel movements, urination, or intercourse.+ body aches, esp legs, fatigue and joint pains.     Physical Exam:BP 124/70 (BP Location: Left Arm, Patient Position: Sitting, Cuff Size: Normal)   Pulse (!) 108   Ht  (1.626 m)   Wt 191 lb 8 oz (86.9 kg)   BMI 32.87 kg/m  General:  Well developed, well nourished, no acute distress Skin:  Warm and dry,has numerous AKs, moles and skin tags(she sees dermatologist regularly) Neck:  Midline trachea, normal thyroid, good ROM, no lymphadenopathy Lungs; Clear to auscultation bilaterally Breast:  No dominant palpable mass, retraction, or nipple discharge Cardiovascular: Regular rate and rhythm Abdomen:  Soft, non tender, no hepatosplenomegaly Pelvic:  External genitalia is normal in appearance, no lesions.  The vagina is normal in appearance. Urethra has no lesions or masses. The cervix is bulbous.  Uterus is felt to be normal size, shape, and contour.  No adnexal masses or tenderness noted.Bladder is non tender, no masses felt. Rectal: Good sphincter tone, no polyps, or hemorrhoids felt.  Hemoccult negative. Extremities/musculoskeletal:  No swelling or varicosities noted, no clubbing or cyanosis Psych:  No mood changes, alert and cooperative,seems happy PHQ 9 score 7 is on meds, denies being suicidal. She did stop Wellbutrin and is on Buspar, and is open to trying Cymbalta, discussed  it may help with body aches.  Impression: 1. Encounter for well woman exam with routine gynecological exam   2. Screening for colorectal cancer   3. Depression, unspecified depression type   4. Hormone replacement therapy (HRT)   5. Anxiety   6. Other fatigue   7. Generalized body aches   8. Elevated cholesterol   9. Elevated hemoglobin A1c       Plan: Check CBC,CMP,TSH and lipids,A1c Meds ordered this encounter  Medications  . DULoxetine (CYMBALTA) 30 MG capsule    Sig: Take 1 capsule (30 mg total) by mouth daily.    Dispense:  30 capsule    Refill:  3    Order Specific Question:   Supervising Provider    Answer:   Duane Lope H [[id number]]  Continue Buspar Has refills on estrace,prometrium and zocor  F/U in 8 weeks Pap and physical in 1 year Mammogram yearly

## 2017-06-30 ENCOUNTER — Telehealth: Payer: Self-pay | Admitting: Adult Health

## 2017-06-30 LAB — COMPREHENSIVE METABOLIC PANEL
A/G RATIO: 1.7 (ref 1.2–2.2)
ALT: 28 IU/L (ref 0–32)
AST: 20 IU/L (ref 0–40)
Albumin: 4.4 g/dL (ref 3.5–5.5)
Alkaline Phosphatase: 61 IU/L (ref 39–117)
BUN / CREAT RATIO: 9 (ref 9–23)
BUN: 8 mg/dL (ref 6–24)
Bilirubin Total: 0.3 mg/dL (ref 0.0–1.2)
CALCIUM: 9.8 mg/dL (ref 8.7–10.2)
CO2: 24 mmol/L (ref 20–29)
Chloride: 105 mmol/L (ref 96–106)
Creatinine, Ser: 0.9 mg/dL (ref 0.57–1.00)
GFR calc non Af Amer: 77 mL/min/{1.73_m2} (ref >59)
GFR, EST AFRICAN AMERICAN: 89 mL/min/{1.73_m2} (ref >59)
Globulin, Total: 2.6 g/dL (ref 1.5–4.5)
Glucose: 104 mg/dL — ABNORMAL HIGH (ref 65–99)
Potassium: 4.9 mmol/L (ref 3.5–5.2)
Sodium: 142 mmol/L (ref 134–144)
Total Protein: 7 g/dL (ref 6.0–8.5)

## 2017-06-30 LAB — LIPID PANEL
CHOL/HDL RATIO: 5.5 ratio — AB (ref 0.0–4.4)
Cholesterol, Total: 175 mg/dL (ref 100–199)
HDL: 32 mg/dL — AB (ref >39)
LDL CALC: 99 mg/dL (ref 0–99)
Triglycerides: 222 mg/dL — ABNORMAL HIGH (ref 0–149)
VLDL Cholesterol Cal: 44 mg/dL — ABNORMAL HIGH (ref 5–40)

## 2017-06-30 LAB — CBC
HEMATOCRIT: 46.1 % (ref 34.0–46.6)
Hemoglobin: 15.7 g/dL (ref 11.1–15.9)
MCH: 31.3 pg (ref 26.6–33.0)
MCHC: 34.1 g/dL (ref 31.5–35.7)
MCV: 92 fL (ref 79–97)
Platelets: 269 10*3/uL (ref 150–379)
RBC: 5.01 x10E6/uL (ref 3.77–5.28)
RDW: 13 % (ref 12.3–15.4)
WBC: 7.7 10*3/uL (ref 3.4–10.8)

## 2017-06-30 LAB — HEMOGLOBIN A1C
Est. average glucose Bld gHb Est-mCnc: 120 mg/dL
HEMOGLOBIN A1C: 5.8 % — AB (ref 4.8–5.6)

## 2017-06-30 LAB — TSH: TSH: 1.54 u[IU]/mL (ref 0.450–4.500)

## 2017-06-30 MED ORDER — PAROXETINE HCL 10 MG PO TABS: 10.0000 mg | ORAL_TABLET | Freq: Every day | ORAL | 3 refills | Status: DC

## 2017-06-30 MED ORDER — FENOFIBRATE 54 MG PO TABS: 54.0000 mg | ORAL_TABLET | Freq: Every day | ORAL | 2 refills | Status: DC

## 2017-06-30 NOTE — Telephone Encounter (Signed)
Pt aware of labs, will add fenofibrate, watch carbs and increase activity and recheck labs in June.She tool cymbalta last night and is so sick feeling, so stop will change to paxil.

## 2017-08-24 ENCOUNTER — Ambulatory Visit (INDEPENDENT_AMBULATORY_CARE_PROVIDER_SITE_OTHER): Payer: Managed Care, Other (non HMO) | Admitting: Adult Health

## 2017-08-24 ENCOUNTER — Encounter: Payer: Self-pay | Admitting: Adult Health

## 2017-08-24 ENCOUNTER — Ambulatory Visit: Payer: Managed Care, Other (non HMO) | Admitting: Adult Health

## 2017-08-24 ENCOUNTER — Other Ambulatory Visit: Payer: Self-pay | Admitting: Adult Health

## 2017-08-24 VITALS — BP 117/81 | HR 96 | Ht 64.0 in | Wt 190.5 lb

## 2017-08-24 DIAGNOSIS — F419 Anxiety disorder, unspecified: Secondary | ICD-10-CM

## 2017-08-24 DIAGNOSIS — E7800 Pure hypercholesterolemia, unspecified: Secondary | ICD-10-CM | POA: Diagnosis not present

## 2017-08-24 DIAGNOSIS — J0110 Acute frontal sinusitis, unspecified: Secondary | ICD-10-CM | POA: Insufficient documentation

## 2017-08-24 MED ORDER — PAROXETINE HCL 20 MG PO TABS: 20.0000 mg | ORAL_TABLET | Freq: Every day | ORAL | 6 refills | Status: DC

## 2017-08-24 MED ORDER — AZITHROMYCIN 250 MG PO TABS
ORAL_TABLET | ORAL | 0 refills | Status: DC
Start: 2017-08-24 — End: 2017-12-06

## 2017-08-24 NOTE — Progress Notes (Signed)
  Subjective:     Patient ID: Sabrina Waters, female   DOB: 25-Dec-1970, 47 y.o.   MRN: [id number]  HPI [name] is a 47 year old white female back in follow up on starting paxil and fenofibrate.   Review of Systems +Sinus tenderness +Headache Feels better on paxil but thinks it may need increasing Reviewed past medical,surgical, social and family history. Reviewed medications and allergies.     Objective:   Physical Exam BP 117/81 (BP Location: Left Arm, Patient Position: Sitting, Cuff Size: Normal)   Pulse 96   Ht 5\' 4"  (1.626 m)   Wt 190 lb 8 oz (86.4 kg)   BMI 32.70 kg/m    Skin warm and dry.  Lungs: clear to ausculation bilaterally. Cardiovascular: regular rate and rhythm.+frontal sinus tenderness. PHQ 2 score 1 which is better, is on paxil 10 mg, but will increase to paxil 20 mg and she is thinking about stopping HRT, which is fine, if she wants to try. She is leaving for the beach Saturday.   Assessment:     1. Anxiety   2. Elevated cholesterol   3. Subacute frontal sinusitis       Plan:     Check CMP and Lipids Increase paxil to 20 mg, can take 2 10 mg tabs then start the 20 mg tab Meds ordered this encounter  Medications  . PARoxetine (PAXIL) 20 MG tablet    Sig: Take 1 tablet (20 mg total) by mouth daily.    Dispense:  30 tablet    Refill:  6    Order Specific Question:   Supervising Provider    Answer:   Despina HiddenEURE, LUTHER H [[id number]]  . azithromycin (ZITHROMAX) 250 MG tablet    Sig: Take 2 now and then 1 daily for 4 days    Dispense:  6 tablet    Refill:  0    Order Specific Question:   Supervising Provider    Answer:   Duane LopeEURE, LUTHER H [[id number]]  F/U in 3 months or sooner if needed Ok to stop HRT

## 2017-08-25 ENCOUNTER — Telehealth: Payer: Self-pay | Admitting: Adult Health

## 2017-08-25 LAB — COMPREHENSIVE METABOLIC PANEL
A/G RATIO: 1.7 (ref 1.2–2.2)
ALK PHOS: 56 IU/L (ref 39–117)
ALT: 26 IU/L (ref 0–32)
AST: 19 IU/L (ref 0–40)
Albumin: 4.3 g/dL (ref 3.5–5.5)
BILIRUBIN TOTAL: 0.3 mg/dL (ref 0.0–1.2)
BUN/Creatinine Ratio: 12 (ref 9–23)
BUN: 10 mg/dL (ref 6–24)
CALCIUM: 9.4 mg/dL (ref 8.7–10.2)
CHLORIDE: 105 mmol/L (ref 96–106)
CO2: 24 mmol/L (ref 20–29)
Creatinine, Ser: 0.83 mg/dL (ref 0.57–1.00)
GFR calc Af Amer: 98 mL/min/{1.73_m2} (ref >59)
GFR calc non Af Amer: 85 mL/min/{1.73_m2} (ref >59)
GLOBULIN, TOTAL: 2.5 g/dL (ref 1.5–4.5)
Glucose: 101 mg/dL — ABNORMAL HIGH (ref 65–99)
POTASSIUM: 4.3 mmol/L (ref 3.5–5.2)
SODIUM: 142 mmol/L (ref 134–144)
Total Protein: 6.8 g/dL (ref 6.0–8.5)

## 2017-08-25 LAB — LIPID PANEL
CHOL/HDL RATIO: 6 ratio — AB (ref 0.0–4.4)
CHOLESTEROL TOTAL: 186 mg/dL (ref 100–199)
HDL: 31 mg/dL — ABNORMAL LOW (ref >39)
LDL CALC: 107 mg/dL — AB (ref 0–99)
Triglycerides: 241 mg/dL — ABNORMAL HIGH (ref 0–149)
VLDL Cholesterol Cal: 48 mg/dL — ABNORMAL HIGH (ref 5–40)

## 2017-08-25 MED ORDER — SIMVASTATIN 40 MG PO TABS: 40.0000 mg | ORAL_TABLET | Freq: Every day | ORAL | 3 refills | Status: DC

## 2017-08-25 MED ORDER — FENOFIBRATE 145 MG PO TABS: 145.0000 mg | ORAL_TABLET | Freq: Every day | ORAL | 3 refills | Status: DC

## 2017-08-25 NOTE — Telephone Encounter (Signed)
Pt aware of labs will increase fenofibrate and zocor and recheck labs in September

## 2017-10-25 ENCOUNTER — Other Ambulatory Visit: Payer: Self-pay | Admitting: Adult Health

## 2017-10-26 ENCOUNTER — Other Ambulatory Visit: Payer: Self-pay | Admitting: Adult Health

## 2017-11-24 ENCOUNTER — Ambulatory Visit: Payer: Managed Care, Other (non HMO) | Admitting: Adult Health

## 2017-12-06 ENCOUNTER — Encounter: Payer: Self-pay | Admitting: Adult Health

## 2017-12-06 ENCOUNTER — Ambulatory Visit (INDEPENDENT_AMBULATORY_CARE_PROVIDER_SITE_OTHER): Payer: Managed Care, Other (non HMO) | Admitting: Adult Health

## 2017-12-06 ENCOUNTER — Other Ambulatory Visit: Payer: Self-pay

## 2017-12-06 VITALS — BP 121/79 | HR 93 | Ht 64.0 in | Wt 201.0 lb

## 2017-12-06 DIAGNOSIS — E782 Mixed hyperlipidemia: Secondary | ICD-10-CM | POA: Diagnosis not present

## 2017-12-06 DIAGNOSIS — F419 Anxiety disorder, unspecified: Secondary | ICD-10-CM

## 2017-12-06 MED ORDER — PAROXETINE HCL 30 MG PO TABS: 30.0000 mg | ORAL_TABLET | Freq: Every day | ORAL | 3 refills | Status: DC

## 2017-12-06 NOTE — Progress Notes (Signed)
  Subjective:     Patient ID: Sabrina Waters, female   DOB: 1971-02-14, 47 y.o.   MRN: [id number]  HPI Sabrina Waters is a 47 year old white female, back in follow up on starting paxil and tricor and she stopped the tricor, due to body aches and feeling stiff.   Review of Systems +body aches Thinks Paxil needs increasing Reviewed past medical,surgical, social and family history. Reviewed medications and allergies.     Objective:   Physical Exam BP 121/79 (BP Location: Left Arm, Patient Position: Sitting, Cuff Size: Normal)   Pulse 93   Ht 5\' 4"  (1.626 m)   Wt 201 lb (91.2 kg)   BMI 34.50 kg/m    Skin warm and dry.  Lungs: clear to ausculation bilaterally. Cardiovascular: regular rate and rhythm. PHQ 2 score 1. Will increase paxil to 30 mg and continue buspar.  Assessment:     1. Anxiety   2. Elevated triglycerides with high cholesterol       Plan:     Check CMP and lipids Meds ordered this encounter  Medications  . PARoxetine (PAXIL) 30 MG tablet    Sig: Take 1 tablet (30 mg total) by mouth daily.    Dispense:  30 tablet    Refill:  3    Order Specific Question:   Supervising Provider    Answer:   Duane Lope H [[id number]]  Continue buspar F/U in 7 weeks

## 2017-12-07 LAB — COMPREHENSIVE METABOLIC PANEL
ALBUMIN: 4.2 g/dL (ref 3.5–5.5)
ALK PHOS: 61 IU/L (ref 39–117)
ALT: 36 IU/L — ABNORMAL HIGH (ref 0–32)
AST: 23 IU/L (ref 0–40)
Albumin/Globulin Ratio: 1.8 (ref 1.2–2.2)
BUN / CREAT RATIO: 15 (ref 9–23)
BUN: 11 mg/dL (ref 6–24)
Bilirubin Total: 0.3 mg/dL (ref 0.0–1.2)
CALCIUM: 9.2 mg/dL (ref 8.7–10.2)
CO2: 22 mmol/L (ref 20–29)
CREATININE: 0.75 mg/dL (ref 0.57–1.00)
Chloride: 107 mmol/L — ABNORMAL HIGH (ref 96–106)
GFR, EST AFRICAN AMERICAN: 111 mL/min/{1.73_m2} (ref >59)
GFR, EST NON AFRICAN AMERICAN: 96 mL/min/{1.73_m2} (ref >59)
GLOBULIN, TOTAL: 2.3 g/dL (ref 1.5–4.5)
GLUCOSE: 107 mg/dL — AB (ref 65–99)
Potassium: 4.4 mmol/L (ref 3.5–5.2)
SODIUM: 145 mmol/L — AB (ref 134–144)
Total Protein: 6.5 g/dL (ref 6.0–8.5)

## 2017-12-07 LAB — LIPID PANEL
CHOL/HDL RATIO: 5.1 ratio — AB (ref 0.0–4.4)
Cholesterol, Total: 158 mg/dL (ref 100–199)
HDL: 31 mg/dL — AB (ref >39)
LDL CALC: 89 mg/dL (ref 0–99)
Triglycerides: 189 mg/dL — ABNORMAL HIGH (ref 0–149)
VLDL Cholesterol Cal: 38 mg/dL (ref 5–40)

## 2017-12-09 ENCOUNTER — Telehealth: Payer: Self-pay | Admitting: Adult Health

## 2017-12-09 NOTE — Telephone Encounter (Signed)
Pt aware of labs  

## 2017-12-31 ENCOUNTER — Other Ambulatory Visit: Payer: Self-pay | Admitting: Adult Health

## 2018-01-24 ENCOUNTER — Ambulatory Visit: Payer: Managed Care, Other (non HMO) | Admitting: Adult Health

## 2018-02-09 ENCOUNTER — Ambulatory Visit: Payer: Managed Care, Other (non HMO) | Admitting: Adult Health

## 2018-03-08 ENCOUNTER — Other Ambulatory Visit: Payer: Self-pay | Admitting: Adult Health

## 2018-03-09 ENCOUNTER — Ambulatory Visit: Payer: Managed Care, Other (non HMO) | Admitting: Adult Health

## 2018-03-24 ENCOUNTER — Ambulatory Visit: Payer: Managed Care, Other (non HMO) | Admitting: Adult Health

## 2018-04-08 ENCOUNTER — Ambulatory Visit: Payer: Managed Care, Other (non HMO) | Admitting: Adult Health

## 2018-04-11 ENCOUNTER — Other Ambulatory Visit: Payer: Self-pay | Admitting: Adult Health

## 2018-04-26 ENCOUNTER — Ambulatory Visit (INDEPENDENT_AMBULATORY_CARE_PROVIDER_SITE_OTHER): Payer: Managed Care, Other (non HMO) | Admitting: Adult Health

## 2018-04-26 ENCOUNTER — Encounter: Payer: Self-pay | Admitting: Adult Health

## 2018-04-26 VITALS — BP 131/83 | HR 109 | Ht 64.0 in | Wt 207.5 lb

## 2018-04-26 DIAGNOSIS — R52 Pain, unspecified: Secondary | ICD-10-CM | POA: Diagnosis not present

## 2018-04-26 DIAGNOSIS — F329 Major depressive disorder, single episode, unspecified: Secondary | ICD-10-CM

## 2018-04-26 DIAGNOSIS — R5383 Other fatigue: Secondary | ICD-10-CM | POA: Insufficient documentation

## 2018-04-26 DIAGNOSIS — F419 Anxiety disorder, unspecified: Secondary | ICD-10-CM

## 2018-04-26 DIAGNOSIS — R0683 Snoring: Secondary | ICD-10-CM

## 2018-04-26 DIAGNOSIS — F32A Depression, unspecified: Secondary | ICD-10-CM | POA: Insufficient documentation

## 2018-04-26 MED ORDER — VENLAFAXINE HCL ER 75 MG PO CP24: 75.0000 mg | ORAL_CAPSULE | Freq: Every day | ORAL | 3 refills | Status: DC

## 2018-04-26 NOTE — Progress Notes (Signed)
Patient ID: Sabrina Waters, female   DOB: 10-12-1970, 48 y.o.   MRN: [id number]

## 2018-04-26 NOTE — Progress Notes (Signed)
Patient ID: Sabrina Waters, female   DOB: [birth year], 48 y.o.   MRN: [id number] History of Present Illness:  Kenna is a 48 year old white female, in long term relationship in with multiple complaints, +tired, +wegiht gain,+snoring,+body aches,+hot flashes.   Current Medications, Allergies, Past Medical History, Past Surgical History, Family History and Social History were reviewed in [employer] record.     Review of Systems: Anxiety seems worse, on paxil  +tired +body aches +increase in snoring,had home sleep study but insurance will not cover hospital sleep study to determine C pap need +hot flashes +weight gain    Physical Exam:BP 131/83 (BP Location: Left Arm, Patient Position: Sitting, Cuff Size: Large)   Pulse (!) 109   Ht 5\' 4"  (1.626 m)   Wt 207 lb 8 oz (94.1 kg)   BMI 35.62 kg/m  General:  Well developed, well nourished, no acute distress Skin:  Warm and dry Lungs; Clear to auscultation bilaterally Cardiovascular: Regular rate and rhythm Psych:  No mood changes, alert and cooperative,seems happy Fall risk is low. PHQ 9 score 17, denies being suicidal, is on paxil and buspar, will stop paxil and continue buspar and try effexor.   Impression: 1. Anxiety and depression   2. Generalized body aches   3. Tired   4. Snores       Plan: Stop Paxil, and start effexor Meds ordered this encounter  Medications  . venlafaxine XR (EFFEXOR-XR) 75 MG 24 hr capsule    Sig: Take 1 capsule (75 mg total) by mouth daily.    Dispense:  30 capsule    Refill:  3    Order Specific Question:   Supervising Provider    Answer:   Duane Lope H [[id number]]  F/U in 4 weeks, she is going to [place] in April and requests meds for plane ride, will address at that visit

## 2018-05-18 ENCOUNTER — Other Ambulatory Visit: Payer: Self-pay | Admitting: Adult Health

## 2018-05-23 ENCOUNTER — Telehealth: Payer: Self-pay | Admitting: *Deleted

## 2018-05-23 NOTE — Telephone Encounter (Signed)
Patient made aware no visitors are allowed back during her visit. Prescreening questions asked.  At this time, have come in contact with someone in the last month that has been confirmed or suspected of having Covid-19? NO Are you experiencing a fever, cough, SOB, muscle pain, diarrhea, rash, vomiting, abdominal pain, red eye, weakness, bruising or bleeding, joint pain or severe headache? NO

## 2018-05-24 ENCOUNTER — Other Ambulatory Visit: Payer: Self-pay

## 2018-05-24 ENCOUNTER — Ambulatory Visit (INDEPENDENT_AMBULATORY_CARE_PROVIDER_SITE_OTHER): Payer: Managed Care, Other (non HMO) | Admitting: Adult Health

## 2018-05-24 ENCOUNTER — Encounter: Payer: Self-pay | Admitting: Adult Health

## 2018-05-24 VITALS — BP 132/84 | HR 104 | Ht 64.0 in | Wt 201.0 lb

## 2018-05-24 DIAGNOSIS — R52 Pain, unspecified: Secondary | ICD-10-CM

## 2018-05-24 DIAGNOSIS — F419 Anxiety disorder, unspecified: Secondary | ICD-10-CM

## 2018-05-24 DIAGNOSIS — F329 Major depressive disorder, single episode, unspecified: Secondary | ICD-10-CM | POA: Diagnosis not present

## 2018-05-24 MED ORDER — CYCLOBENZAPRINE HCL 10 MG PO TABS: 10.0000 mg | ORAL_TABLET | Freq: Three times a day (TID) | ORAL | 2 refills | Status: DC | PRN

## 2018-05-24 NOTE — Progress Notes (Signed)
Patient ID: Sabrina Waters, female   DOB: August 28, 1970, 48 y.o.   MRN: [id number] History of Present Illness: Sabrina Waters is 48 year old white female, back in follow up after starting effexor and is better.    Current Medications, Allergies, Past Medical History, Past Surgical History, Family History and Social History were reviewed in [employer] record.     Review of Systems: Feels better, less anxiety and depression +body aches, esp neck and shoulders    Physical Exam:BP 132/84 (BP Location: Left Arm, Patient Position: Sitting, Cuff Size: Normal)   Pulse (!) 104   Ht 5\' 4"  (1.626 m)   Wt 201 lb (91.2 kg)   BMI 34.50 kg/m She has lost 6 lbs, but had the flu.  General:  Well developed, well nourished, no acute distress Skin:  Warm and dry Lungs; Clear to auscultation bilaterally Cardiovascular: Regular rate and rhythm Psych:  No mood changes, alert and cooperative,seems happy PHQ 9 score is 5 which is down from 17 on 04/26/18. Continue effexor and buspar. Will add flexeril esp at night,but can take 1 every 8 hours,prn.   Impression: 1. Anxiety and depression   2. Generalized body aches       Plan: Continue Effexor and Buspar, has refills  Meds ordered this encounter  Medications  . cyclobenzaprine (FLEXERIL) 10 MG tablet    Sig: Take 1 tablet (10 mg total) by mouth 3 (three) times daily as needed for muscle spasms.    Dispense:  30 tablet    Refill:  2    Order Specific Question:   Supervising Provider    Answer:   Lazaro Arms [[id number]]  Call me in about a month in F/U  Return in about 3 months

## 2018-06-19 ENCOUNTER — Other Ambulatory Visit: Payer: Self-pay | Admitting: Adult Health

## 2018-06-22 ENCOUNTER — Other Ambulatory Visit: Payer: Self-pay | Admitting: Adult Health

## 2018-06-22 IMAGING — DX DG CHEST 2V
2 series · 2 of 2 positions shown · non-contrast
Comparison: Report from 08/22/2010

CLINICAL DATA: Chest pain and migraines.  Dyspnea.

EXAM:
CHEST  2 VIEW

[chest pa]
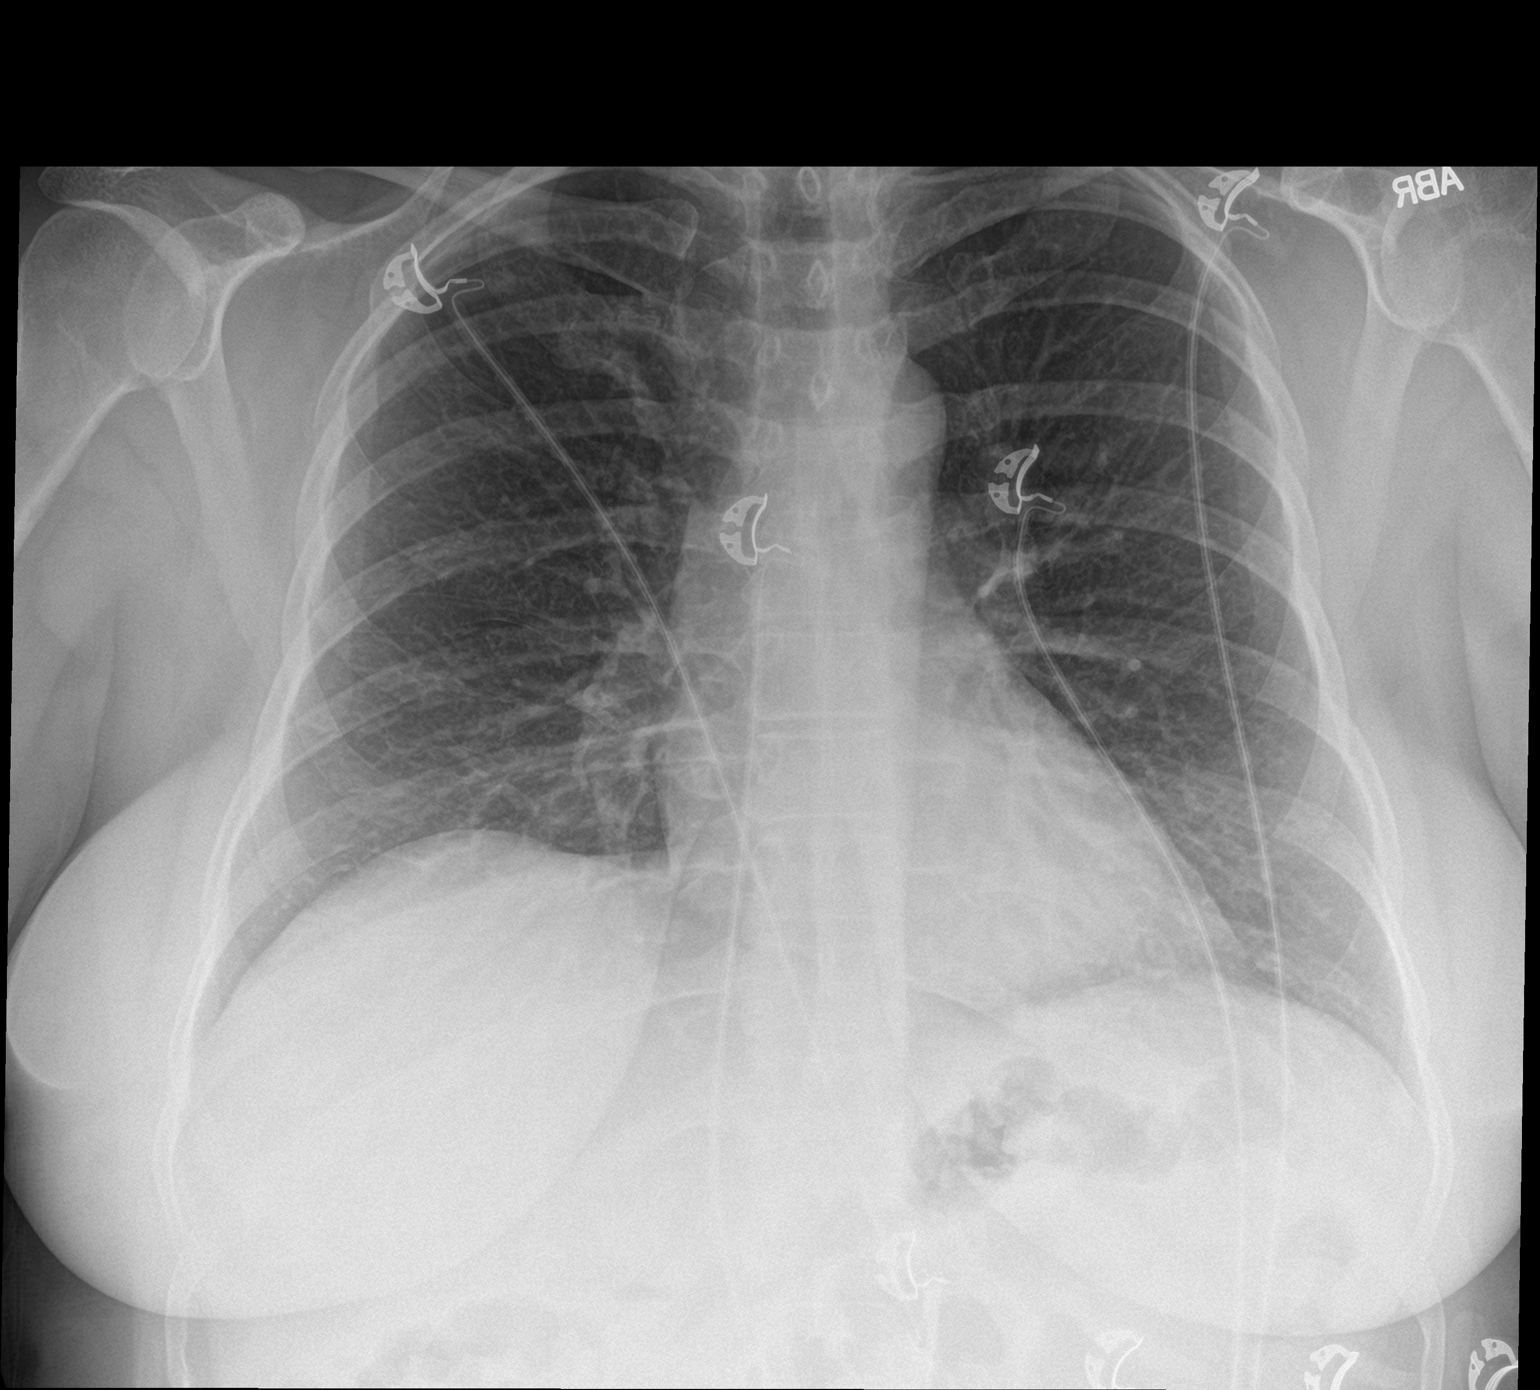

[chest lat]
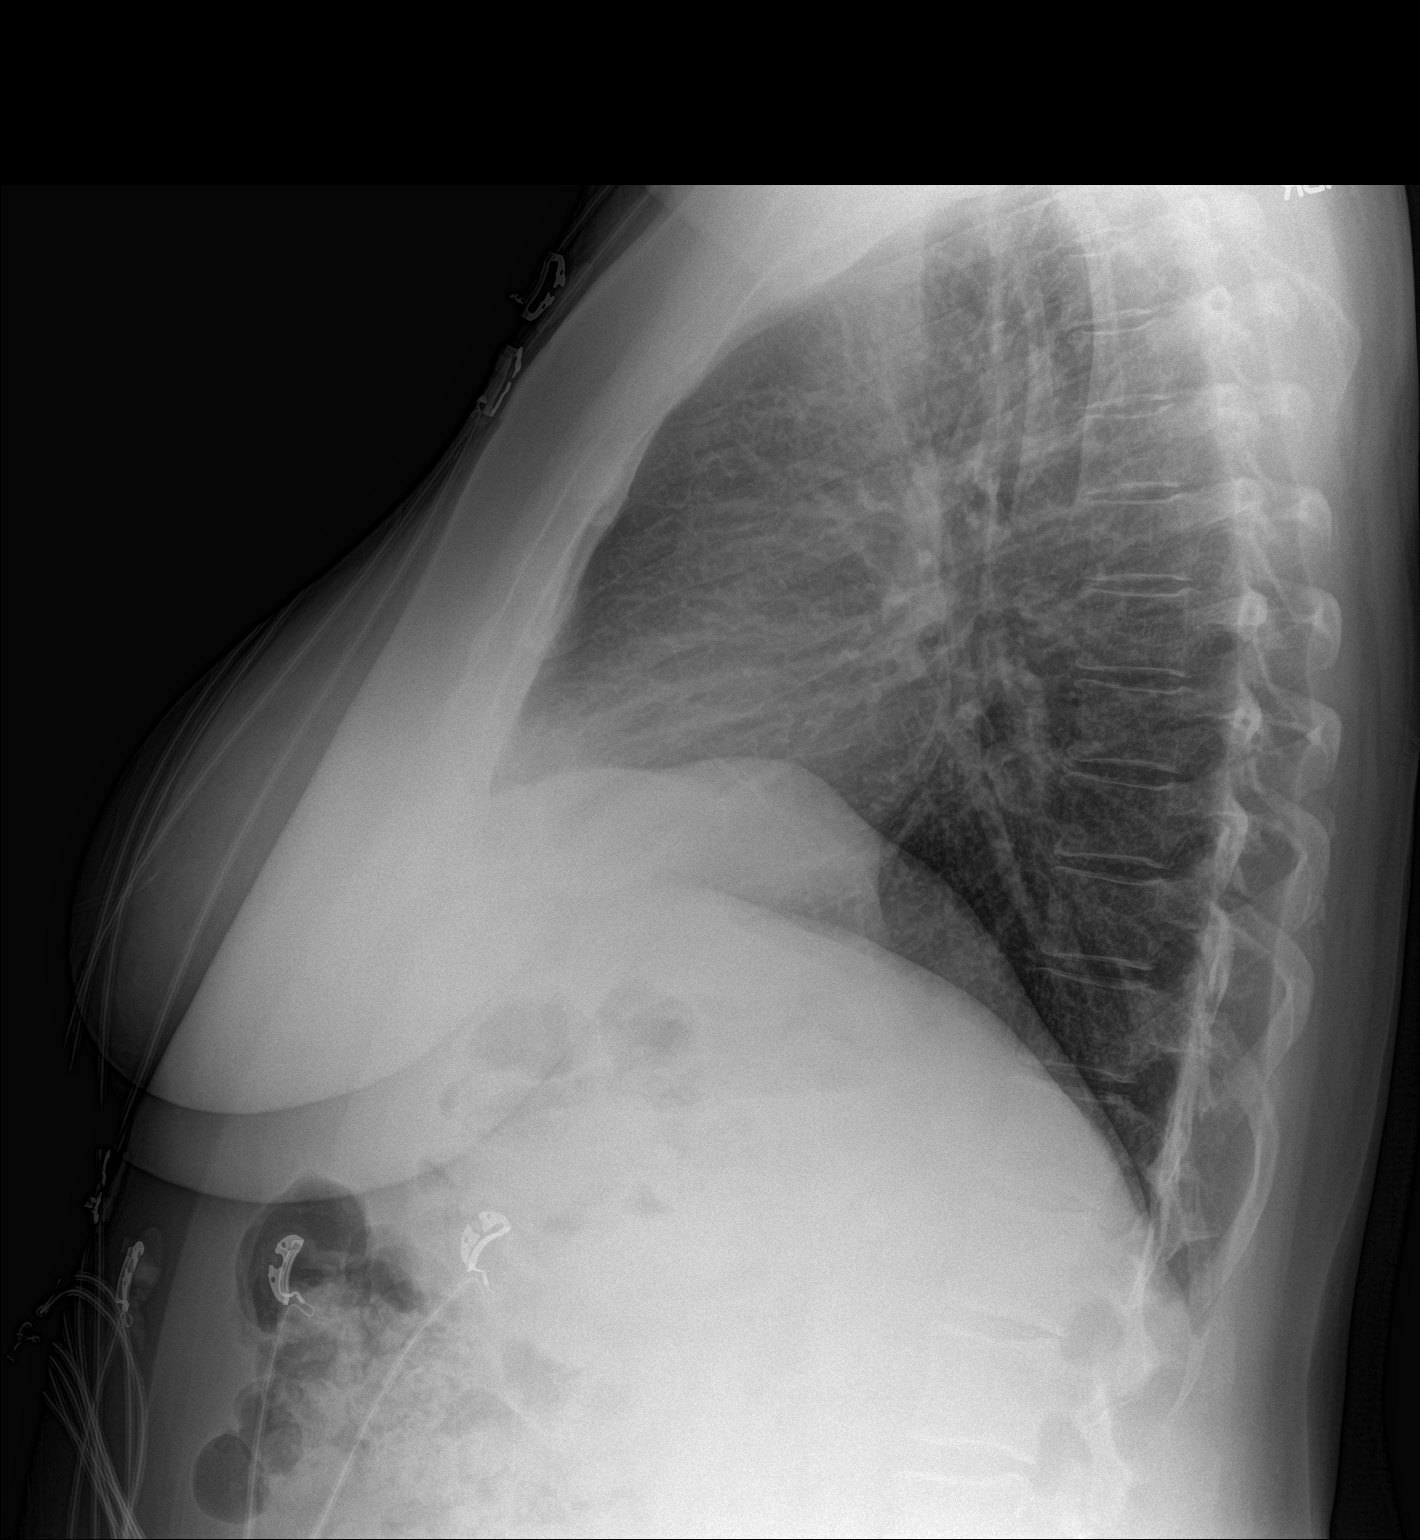

[2 of 2 positions shown; findings below may reference images not displayed]

FINDINGS: The heart size and mediastinal contours are within normal limits.
Both lungs are clear. The visualized skeletal structures are
unremarkable.
IMPRESSION: No active cardiopulmonary disease.

## 2018-06-22 MED ORDER — CEPHALEXIN 500 MG PO CAPS: 500.0000 mg | ORAL_CAPSULE | Freq: Four times a day (QID) | ORAL | 0 refills | Status: DC

## 2018-06-22 NOTE — Progress Notes (Signed)
rx keflex for sinus/ear infection

## 2018-08-20 ENCOUNTER — Other Ambulatory Visit: Payer: Self-pay | Admitting: Adult Health

## 2018-08-24 ENCOUNTER — Ambulatory Visit: Payer: Managed Care, Other (non HMO) | Admitting: Adult Health

## 2018-09-13 ENCOUNTER — Ambulatory Visit: Payer: Managed Care, Other (non HMO) | Admitting: Adult Health

## 2018-09-20 ENCOUNTER — Other Ambulatory Visit: Payer: Self-pay | Admitting: Adult Health

## 2018-09-22 ENCOUNTER — Ambulatory Visit: Payer: Managed Care, Other (non HMO) | Admitting: Adult Health

## 2018-09-29 ENCOUNTER — Ambulatory Visit (INDEPENDENT_AMBULATORY_CARE_PROVIDER_SITE_OTHER): Payer: Managed Care, Other (non HMO) | Admitting: Adult Health

## 2018-09-29 ENCOUNTER — Encounter: Payer: Self-pay | Admitting: Adult Health

## 2018-09-29 ENCOUNTER — Other Ambulatory Visit: Payer: Self-pay

## 2018-09-29 VITALS — BP 119/82 | HR 101 | Ht 64.0 in | Wt 206.5 lb

## 2018-09-29 DIAGNOSIS — F329 Major depressive disorder, single episode, unspecified: Secondary | ICD-10-CM | POA: Diagnosis not present

## 2018-09-29 DIAGNOSIS — F419 Anxiety disorder, unspecified: Secondary | ICD-10-CM

## 2018-09-29 DIAGNOSIS — R52 Pain, unspecified: Secondary | ICD-10-CM

## 2018-09-29 MED ORDER — VENLAFAXINE HCL ER 150 MG PO CP24: 150.0000 mg | ORAL_CAPSULE | Freq: Every day | ORAL | 6 refills | Status: DC

## 2018-09-29 NOTE — Progress Notes (Signed)
Patient ID: Sabrina Waters, female   DOB: [birth year], 48 y.o.   MRN: [id number] History of Present Illness: Sabrina Waters is a 48 year old white female, back in follow up on Effexor. Depression better but still with anxiety and body aches.    Current Medications, Allergies, Past Medical History, Past Surgical History, Family History and Social History were reviewed in [employer] record.     Review of Systems: Still with some body aches and anxiety, but depression better     Physical Exam:  BP 119/82 (BP Location: Left Arm, Patient Position: Sitting, Cuff Size: Large)   Pulse (!) 101   Ht 5\' 4"  (1.626 m)   Wt 206 lb 8 oz (93.7 kg)   BMI 35.45 kg/m  General:  Well developed, well nourished, no acute distress Skin:  Warm and dry,tan Lungs; Clear to auscultation bilaterally Cardiovascular: Regular rate and rhythm Psych:  No mood changes, alert and cooperative,seems happy PHQ 2 score 1 Fall risk is low Has gained 5 lbs. Will increase Effexor XR  to 150 mg daily    Impression:  1. Anxiety and depression   2. Generalized body aches      Plan:  Will increase Effexor XR to 150 mg daily  Get in pool more Follow up in 9 weeks or so.

## 2018-10-21 ENCOUNTER — Other Ambulatory Visit: Payer: Self-pay

## 2018-10-21 DIAGNOSIS — Z20822 Contact with and (suspected) exposure to covid-19: Secondary | ICD-10-CM

## 2018-10-22 LAB — NOVEL CORONAVIRUS, NAA: SARS-CoV-2, NAA: NOT DETECTED

## 2018-12-30 ENCOUNTER — Other Ambulatory Visit: Payer: Self-pay

## 2018-12-30 DIAGNOSIS — Z20822 Contact with and (suspected) exposure to covid-19: Secondary | ICD-10-CM

## 2019-01-01 LAB — NOVEL CORONAVIRUS, NAA: SARS-CoV-2, NAA: NOT DETECTED

## 2019-01-04 ENCOUNTER — Other Ambulatory Visit: Payer: Self-pay | Admitting: Adult Health

## 2019-01-04 MED ORDER — CEPHALEXIN 500 MG PO CAPS: 500.0000 mg | ORAL_CAPSULE | Freq: Four times a day (QID) | ORAL | 0 refills | Status: DC

## 2019-01-04 NOTE — Progress Notes (Signed)
Will rx keflex for sinus

## 2019-01-26 ENCOUNTER — Other Ambulatory Visit: Payer: Self-pay | Admitting: Adult Health

## 2019-02-06 ENCOUNTER — Other Ambulatory Visit: Payer: Self-pay | Admitting: Adult Health

## 2019-05-04 ENCOUNTER — Other Ambulatory Visit: Payer: Self-pay | Admitting: Adult Health

## 2019-06-07 ENCOUNTER — Other Ambulatory Visit: Payer: Self-pay | Admitting: Adult Health

## 2019-10-04 ENCOUNTER — Other Ambulatory Visit: Payer: Self-pay | Admitting: Adult Health

## 2019-10-26 ENCOUNTER — Other Ambulatory Visit: Payer: Self-pay | Admitting: Adult Health

## 2019-10-26 DIAGNOSIS — F419 Anxiety disorder, unspecified: Secondary | ICD-10-CM

## 2019-10-26 NOTE — Progress Notes (Signed)
Referred to BH  

## 2019-10-31 ENCOUNTER — Other Ambulatory Visit: Payer: Self-pay | Admitting: Adult Health

## 2019-10-31 MED ORDER — HYDROXYZINE HCL 10 MG PO TABS: 10.0000 mg | ORAL_TABLET | Freq: Three times a day (TID) | ORAL | 1 refills | Status: DC | PRN

## 2019-10-31 NOTE — Progress Notes (Signed)
rx vistaril

## 2019-11-15 ENCOUNTER — Encounter: Payer: Self-pay | Admitting: Adult Health

## 2019-11-15 ENCOUNTER — Other Ambulatory Visit: Payer: Self-pay

## 2019-11-15 ENCOUNTER — Other Ambulatory Visit (HOSPITAL_COMMUNITY)
Admission: RE | Admit: 2019-11-15 | Discharge: 2019-11-15 | Disposition: A | Discharge status: Home / Self Care | Payer: Managed Care, Other (non HMO) | Source: Ambulatory Visit | Attending: Adult Health | Admitting: Adult Health

## 2019-11-15 ENCOUNTER — Ambulatory Visit (INDEPENDENT_AMBULATORY_CARE_PROVIDER_SITE_OTHER): Payer: Managed Care, Other (non HMO) | Admitting: Adult Health

## 2019-11-15 VITALS — BP 140/91 | HR 109 | Ht 64.0 in | Wt 208.0 lb

## 2019-11-15 DIAGNOSIS — F329 Major depressive disorder, single episode, unspecified: Secondary | ICD-10-CM

## 2019-11-15 DIAGNOSIS — E782 Mixed hyperlipidemia: Secondary | ICD-10-CM | POA: Insufficient documentation

## 2019-11-15 DIAGNOSIS — Z01419 Encounter for gynecological examination (general) (routine) without abnormal findings: Secondary | ICD-10-CM | POA: Diagnosis present

## 2019-11-15 DIAGNOSIS — F419 Anxiety disorder, unspecified: Secondary | ICD-10-CM | POA: Diagnosis not present

## 2019-11-15 DIAGNOSIS — Z131 Encounter for screening for diabetes mellitus: Secondary | ICD-10-CM | POA: Insufficient documentation

## 2019-11-15 DIAGNOSIS — R030 Elevated blood-pressure reading, without diagnosis of hypertension: Secondary | ICD-10-CM

## 2019-11-15 DIAGNOSIS — Z1211 Encounter for screening for malignant neoplasm of colon: Secondary | ICD-10-CM | POA: Insufficient documentation

## 2019-11-15 LAB — HEMOCCULT GUIAC POC 1CARD (OFFICE): Fecal Occult Blood, POC: NEGATIVE

## 2019-11-15 MED ORDER — BUSPIRONE HCL 15 MG PO TABS: ORAL_TABLET | ORAL | 6 refills | Status: DC

## 2019-11-15 MED ORDER — SIMVASTATIN 40 MG PO TABS: ORAL_TABLET | ORAL | 6 refills | Status: DC

## 2019-11-15 MED ORDER — VENLAFAXINE HCL ER 150 MG PO CP24: ORAL_CAPSULE | ORAL | 6 refills | Status: DC

## 2019-11-15 NOTE — Patient Instructions (Signed)
DASH Eating Plan DASH stands for "Dietary Approaches to Stop Hypertension." The DASH eating plan is a healthy eating plan that has been shown to reduce high blood pressure (hypertension). It may also reduce your risk for type 2 diabetes, heart disease, and stroke. The DASH eating plan may also help with weight loss. What are tips for following this plan?  General guidelines  Avoid eating more than 2,300 mg (milligrams) of salt (sodium) a day. If you have hypertension, you may need to reduce your sodium intake to 1,500 mg a day.  Limit alcohol intake to no more than 1 drink a day for nonpregnant women and 2 drinks a day for men. One drink equals 12 oz of beer, 5 oz of wine, or 1 oz of hard liquor.  Work with your health care provider to maintain a healthy body weight or to lose weight. Ask what an ideal weight is for you.  Get at least 30 minutes of exercise that causes your heart to beat faster (aerobic exercise) most days of the week. Activities may include walking, swimming, or biking.  Work with your health care provider or diet and nutrition specialist (dietitian) to adjust your eating plan to your individual calorie needs. Reading food labels   Check food labels for the amount of sodium per serving. Choose foods with less than 5 percent of the Daily Value of sodium. Generally, foods with less than 300 mg of sodium per serving fit into this eating plan.  To find whole grains, look for the word "whole" as the first word in the ingredient list. Shopping  Buy products labeled as "low-sodium" or "no salt added."  Buy fresh foods. Avoid canned foods and premade or frozen meals. Cooking  Avoid adding salt when cooking. Use salt-free seasonings or herbs instead of table salt or sea salt. Check with your health care provider or pharmacist before using salt substitutes.  Do not fry foods. Cook foods using healthy methods such as baking, boiling, grilling, and broiling instead.  Cook with  heart-healthy oils, such as olive, canola, soybean, or sunflower oil. Meal planning  Eat a balanced diet that includes: ? 5 or more servings of fruits and vegetables each day. At each meal, try to fill half of your plate with fruits and vegetables. ? Up to 6-8 servings of whole grains each day. ? Less than 6 oz of lean meat, poultry, or fish each day. A 3-oz serving of meat is about the same size as a deck of cards. One egg equals 1 oz. ? 2 servings of low-fat dairy each day. ? A serving of nuts, seeds, or beans 5 times each week. ? Heart-healthy fats. Healthy fats called Omega-3 fatty acids are found in foods such as flaxseeds and coldwater fish, like sardines, salmon, and mackerel.  Limit how much you eat of the following: ? Canned or prepackaged foods. ? Food that is high in trans fat, such as fried foods. ? Food that is high in saturated fat, such as fatty meat. ? Sweets, desserts, sugary drinks, and other foods with added sugar. ? Full-fat dairy products.  Do not salt foods before eating.  Try to eat at least 2 vegetarian meals each week.  Eat more home-cooked food and less restaurant, buffet, and fast food.  When eating at a restaurant, ask that your food be prepared with less salt or no salt, if possible. What foods are recommended? The items listed may not be a complete list. Talk with your dietitian about   what dietary choices are best for you. Grains Whole-grain or whole-wheat bread. Whole-grain or whole-wheat pasta. Brown rice. Oatmeal. Quinoa. Bulgur. Whole-grain and low-sodium cereals. Pita bread. Low-fat, low-sodium crackers. Whole-wheat flour tortillas. Vegetables Fresh or frozen vegetables (raw, steamed, roasted, or grilled). Low-sodium or reduced-sodium tomato and vegetable juice. Low-sodium or reduced-sodium tomato sauce and tomato paste. Low-sodium or reduced-sodium canned vegetables. Fruits All fresh, dried, or frozen fruit. Canned fruit in natural juice (without  added sugar). Meat and other protein foods Skinless chicken or turkey. Ground chicken or turkey. Pork with fat trimmed off. Fish and seafood. Egg whites. Dried beans, peas, or lentils. Unsalted nuts, nut butters, and seeds. Unsalted canned beans. Lean cuts of beef with fat trimmed off. Low-sodium, lean deli meat. Dairy Low-fat (1%) or fat-free (skim) milk. Fat-free, low-fat, or reduced-fat cheeses. Nonfat, low-sodium ricotta or cottage cheese. Low-fat or nonfat yogurt. Low-fat, low-sodium cheese. Fats and oils Soft margarine without trans fats. Vegetable oil. Low-fat, reduced-fat, or light mayonnaise and salad dressings (reduced-sodium). Canola, safflower, olive, soybean, and sunflower oils. Avocado. Seasoning and other foods Herbs. Spices. Seasoning mixes without salt. Unsalted popcorn and pretzels. Fat-free sweets. What foods are not recommended? The items listed may not be a complete list. Talk with your dietitian about what dietary choices are best for you. Grains Baked goods made with fat, such as croissants, muffins, or some breads. Dry pasta or rice meal packs. Vegetables Creamed or fried vegetables. Vegetables in a cheese sauce. Regular canned vegetables (not low-sodium or reduced-sodium). Regular canned tomato sauce and paste (not low-sodium or reduced-sodium). Regular tomato and vegetable juice (not low-sodium or reduced-sodium). Pickles. Olives. Fruits Canned fruit in a light or heavy syrup. Fried fruit. Fruit in cream or butter sauce. Meat and other protein foods Fatty cuts of meat. Ribs. Fried meat. Bacon. Sausage. Bologna and other processed lunch meats. Salami. Fatback. Hotdogs. Bratwurst. Salted nuts and seeds. Canned beans with added salt. Canned or smoked fish. Whole eggs or egg yolks. Chicken or turkey with skin. Dairy Whole or 2% milk, cream, and half-and-half. Whole or full-fat cream cheese. Whole-fat or sweetened yogurt. Full-fat cheese. Nondairy creamers. Whipped toppings.  Processed cheese and cheese spreads. Fats and oils Butter. Stick margarine. Lard. Shortening. Ghee. Bacon fat. Tropical oils, such as coconut, palm kernel, or palm oil. Seasoning and other foods Salted popcorn and pretzels. Onion salt, garlic salt, seasoned salt, table salt, and sea salt. Worcestershire sauce. Tartar sauce. Barbecue sauce. Teriyaki sauce. Soy sauce, including reduced-sodium. Steak sauce. Canned and packaged gravies. Fish sauce. Oyster sauce. Cocktail sauce. Horseradish that you find on the shelf. Ketchup. Mustard. Meat flavorings and tenderizers. Bouillon cubes. Hot sauce and Tabasco sauce. Premade or packaged marinades. Premade or packaged taco seasonings. Relishes. Regular salad dressings. Where to find more information:  National Heart, Lung, and Blood Institute: www.nhlbi.nih.gov  American Heart Association: www.heart.org Summary  The DASH eating plan is a healthy eating plan that has been shown to reduce high blood pressure (hypertension). It may also reduce your risk for type 2 diabetes, heart disease, and stroke.  With the DASH eating plan, you should limit salt (sodium) intake to 2,300 mg a day. If you have hypertension, you may need to reduce your sodium intake to 1,500 mg a day.  When on the DASH eating plan, aim to eat more fresh fruits and vegetables, whole grains, lean proteins, low-fat dairy, and heart-healthy fats.  Work with your health care provider or diet and nutrition specialist (dietitian) to adjust your eating plan to your   individual calorie needs. This information is not intended to replace advice given to you by your health care provider. Make sure you discuss any questions you have with your health care provider. Document Revised: 01/29/2017 Document Reviewed: 02/10/2016 Elsevier Patient Education  2020 Elsevier Inc.  

## 2019-11-15 NOTE — Progress Notes (Signed)
Patient ID: ARLY SALMINEN, female   DOB: 1970/11/02, 49 y.o.   MRN: [id number] History of Present Illness: Sabrina Waters is a 49 year old white female, divorced, G1P1 in for a well woman gyn exam and pap.    Current Medications, Allergies, Past Medical History, Past Surgical History, Family History and Social History were reviewed in [employer] record.     Review of Systems: Patient denies any headaches, hearing loss, fatigue, blurred vision, shortness of breath, chest pain, abdominal pain, problems with bowel movements, urination, or intercourse. No joint pain or mood swings. No bleeding since ablation  Has been depressed since BF mom and sister killed in MVA last year.   Physical Exam:BP (!) 140/91 (BP Location: Right Arm, Patient Position: Sitting, Cuff Size: Normal)   Pulse (!) 109   Ht 5\' 4"  (1.626 m)   Wt 208 lb (94.3 kg)   BMI 35.70 kg/m  General:  Well developed, well nourished, no acute distress Skin:  Warm and dry,has numerous AKs and moles, and skin tags Neck:  Midline trachea, normal thyroid, good ROM, no lymphadenopathy Lungs; Clear to auscultation bilaterally Breast:  No dominant palpable mass, retraction, or nipple discharge Cardiovascular: Regular rate and rhythm Abdomen:  Soft, non tender, no hepatosplenomegaly Pelvic:  External genitalia is normal in appearance, no lesions.  The vagina is normal in appearance. Urethra has no lesions or masses. The cervix is bulbous.Pap with high risk genotyping performed.  Uterus is felt to be normal size, shape, and contour.  No adnexal masses or tenderness noted.Bladder is non tender, no masses felt. Rectal: Good sphincter tone, no polyps, or hemorrhoids felt.  Hemoccult negative. Extremities/musculoskeletal:  No swelling or varicosities noted, no clubbing or cyanosis Psych:  No mood changes, alert and cooperative,seems happy AA is 2 Fall risk is low PHQ 9 score is 10, no SI, on meds, and has appt with [facility] 01/12/20  she says  [facility] - 11/15/19 0948      Pregnancy Intention Screening   Does the patient want to become pregnant in the next year? No    Does the patient's partner want to become pregnant in the next year? No    Would the patient like to discuss contraceptive options today? No      Contraception Wrap Up   Current Method No Contraceptive Precautions   ablation   End Method No Contraception Precautions   ablation   Contraception Counseling Provided No         Examination chaperoned by 11/17/19 LPN  Impression and Plan: 1. Encounter for gynecological examination with Papanicolaou smear of cervix Pap sent Physical in 1 year  Pap in 3 if normal Needs mammogram  Colonoscopy or cologuard advised by 50 Check CBC,CMP,TSH   2. Encounter for screening fecal occult blood testing   3. Anxiety and depression Continue Effexor and BuSpar, has vistaril if needed  Meds ordered this encounter  Medications  . busPIRone (BUSPAR) 15 MG tablet    Sig: TAKE 1 TABLET(15 MG) BY MOUTH TWICE DAILY    Dispense:  60 tablet    Refill:  6    Order Specific Question:   Supervising Provider    Answer:   Nance Pear, Sabrina Waters H [[id number]]  . simvastatin (ZOCOR) 40 MG tablet    Sig: TAKE 1 TABLET(40 MG) BY MOUTH DAILY    Dispense:  30 tablet    Refill:  6    Order Specific Question:   Supervising Provider    Answer:  EURE, Sabrina Waters H [[id number]]  . venlafaxine XR (EFFEXOR-XR) 150 MG 24 hr capsule    Sig: TAKE 1 CAPSULE(150 MG) BY MOUTH DAILY WITH BREAKFAST    Dispense:  30 capsule    Refill:  6    Order Specific Question:   Supervising Provider    Answer:   Despina Hidden, Sabrina Waters H [[id number]]    4. Elevated triglycerides with high cholesterol Check lipid profile Will refill zocor  5. Screening for diabetes mellitus Check A1c  6. Elevated BP without diagnosis of hypertension Decrease salt and sugar Review DASH diet Recheck BP in 3 weeks

## 2019-11-16 LAB — COMPREHENSIVE METABOLIC PANEL
ALT: 47 IU/L — ABNORMAL HIGH (ref 0–32)
AST: 27 IU/L (ref 0–40)
Albumin/Globulin Ratio: 1.6 (ref 1.2–2.2)
Albumin: 4.4 g/dL (ref 3.8–4.8)
Alkaline Phosphatase: 84 IU/L (ref 44–121)
BUN/Creatinine Ratio: 17 (ref 9–23)
BUN: 14 mg/dL (ref 6–24)
Bilirubin Total: 0.3 mg/dL (ref 0.0–1.2)
CO2: 26 mmol/L (ref 20–29)
Calcium: 9.8 mg/dL (ref 8.7–10.2)
Chloride: 104 mmol/L (ref 96–106)
Creatinine, Ser: 0.81 mg/dL (ref 0.57–1.00)
GFR calc Af Amer: 99 mL/min/{1.73_m2} (ref >59)
GFR calc non Af Amer: 86 mL/min/{1.73_m2} (ref >59)
Globulin, Total: 2.7 g/dL (ref 1.5–4.5)
Glucose: 97 mg/dL (ref 65–99)
Potassium: 5 mmol/L (ref 3.5–5.2)
Sodium: 142 mmol/L (ref 134–144)
Total Protein: 7.1 g/dL (ref 6.0–8.5)

## 2019-11-16 LAB — LIPID PANEL
Chol/HDL Ratio: 5.8 ratio — ABNORMAL HIGH (ref 0.0–4.4)
Cholesterol, Total: 220 mg/dL — ABNORMAL HIGH (ref 100–199)
HDL: 38 mg/dL — ABNORMAL LOW (ref >39)
LDL Chol Calc (NIH): 139 mg/dL — ABNORMAL HIGH (ref 0–99)
Triglycerides: 235 mg/dL — ABNORMAL HIGH (ref 0–149)
VLDL Cholesterol Cal: 43 mg/dL — ABNORMAL HIGH (ref 5–40)

## 2019-11-16 LAB — CBC
Hematocrit: 48 % — ABNORMAL HIGH (ref 34.0–46.6)
Hemoglobin: 15.9 g/dL (ref 11.1–15.9)
MCH: 31.1 pg (ref 26.6–33.0)
MCHC: 33.1 g/dL (ref 31.5–35.7)
MCV: 94 fL (ref 79–97)
Platelets: 225 10*3/uL (ref 150–450)
RBC: 5.12 x10E6/uL (ref 3.77–5.28)
RDW: 12.1 % (ref 11.7–15.4)
WBC: 7.8 10*3/uL (ref 3.4–10.8)

## 2019-11-16 LAB — HEMOGLOBIN A1C
Est. average glucose Bld gHb Est-mCnc: 131 mg/dL
Hgb A1c MFr Bld: 6.2 % — ABNORMAL HIGH (ref 4.8–5.6)

## 2019-11-16 LAB — TSH: TSH: 1.31 u[IU]/mL (ref 0.450–4.500)

## 2019-11-17 LAB — CYTOLOGY - PAP
Comment: NEGATIVE
High risk HPV: NEGATIVE

## 2019-11-20 ENCOUNTER — Telehealth: Payer: Self-pay | Admitting: Adult Health

## 2019-11-20 ENCOUNTER — Encounter: Payer: Self-pay | Admitting: Adult Health

## 2019-11-20 DIAGNOSIS — R87612 Low grade squamous intraepithelial lesion on cytologic smear of cervix (LGSIL): Secondary | ICD-10-CM

## 2019-11-20 DIAGNOSIS — R7303 Prediabetes: Secondary | ICD-10-CM | POA: Insufficient documentation

## 2019-11-20 HISTORY — DX: Prediabetes: R73.03

## 2019-11-20 HISTORY — DX: Low grade squamous intraepithelial lesion on cytologic smear of cervix (LGSIL): R87.612

## 2019-11-20 MED ORDER — PRAVASTATIN SODIUM 40 MG PO TABS: 40.0000 mg | ORAL_TABLET | Freq: Every day | ORAL | 3 refills | Status: DC

## 2019-11-20 NOTE — Telephone Encounter (Signed)
Pt aware of labs, decrease carbs and walk more, as A1c is 6.2 so prediabetic and will stop Zocor and try Pravachol and will repeat pap in 3 years per ASCCP guidelines

## 2019-12-12 ENCOUNTER — Ambulatory Visit: Payer: Managed Care, Other (non HMO) | Admitting: Adult Health

## 2019-12-21 ENCOUNTER — Other Ambulatory Visit: Payer: Self-pay

## 2019-12-21 ENCOUNTER — Encounter: Payer: Self-pay | Admitting: Adult Health

## 2019-12-21 ENCOUNTER — Ambulatory Visit (INDEPENDENT_AMBULATORY_CARE_PROVIDER_SITE_OTHER): Payer: Managed Care, Other (non HMO) | Admitting: Adult Health

## 2019-12-21 VITALS — BP 114/84 | HR 102 | Ht 64.0 in | Wt 201.0 lb

## 2019-12-21 DIAGNOSIS — F419 Anxiety disorder, unspecified: Secondary | ICD-10-CM

## 2019-12-21 DIAGNOSIS — F32A Depression, unspecified: Secondary | ICD-10-CM | POA: Diagnosis not present

## 2019-12-21 NOTE — Progress Notes (Signed)
  Subjective:     Patient ID: Sabrina Waters, female   DOB: 02-Apr-1970, 49 y.o.   MRN: [id number]  HPI Sabrina Waters is a 49 year old white female,divorced, G1P1 in for recheck on anxiety and depression and BP.She says she feels good.   Review of Systems Not as depressed Feels good Reviewed past medical,surgical, social and family history. Reviewed medications and allergies.     Objective:   Physical Exam BP 114/84 (BP Location: Right Arm, Patient Position: Sitting, Cuff Size: Large)   Pulse (!) 102   Ht 5\' 4"  (1.626 m)   Wt 201 lb (91.2 kg)   BMI 34.50 kg/m  Skin warm and dry. Lungs: clear to ausculation bilaterally. Cardiovascular: regular rate and rhythm.   She has lost 7 lbs and BP is normal. Praised over weight loss and BP  PHQ 9 score is 5 which is down from 10, September 15,2021  [facility] - 12/21/19 12/23/19      Pregnancy Intention Screening   Does the patient want to become pregnant in the next year? N/A    Does the patient's partner want to become pregnant in the next year? N/A    Would the patient like to discuss contraceptive options today? N/A      Contraception Wrap Up   Current Method No Method - Other Reason   ablation   End Method No Method - Other Reason   ablation   Contraception Counseling Provided No          Assessment:     1. Anxiety and depression Continue Effexor and Buspar and vistaril prn Sees [facility] 01/12/20     Plan:     Follow up in 2 months for ROS and fasting labs,CMP,lipids and A1c

## 2020-01-12 ENCOUNTER — Telehealth (HOSPITAL_COMMUNITY): Payer: Self-pay | Admitting: Psychiatry

## 2020-01-22 NOTE — Progress Notes (Deleted)
Psychiatric Initial Adult Assessment   Patient Identification: Sabrina Waters MRN:  [id number] Date of Evaluation:  01/22/2020 Referral Source: *** Chief Complaint:   Visit Diagnosis: No diagnosis found.  History of Present Illness:   Sabrina Waters is a 49 y.o. year old female with a history of depression, anxiety, who is referred for         Associated Signs/Symptoms: Depression Symptoms:  {DEPRESSION SYMPTOMS:20000} (Hypo) Manic Symptoms:  {BHH MANIC SYMPTOMS:22872} Anxiety Symptoms:  {BHH ANXIETY SYMPTOMS:22873} Psychotic Symptoms:  {BHH PSYCHOTIC SYMPTOMS:22874} PTSD Symptoms: {BHH PTSD SYMPTOMS:22875}  Past Psychiatric History:  Outpatient:  Psychiatry admission:  Previous suicide attempt:  Past trials of medication:  History of violence:   Previous Psychotropic Medications: {YES/NO:21197}  Substance Abuse History in the last 12 months:  {yes no:314532}  Consequences of Substance Abuse: {BHH CONSEQUENCES OF SUBSTANCE ABUSE:22880}  Past Medical History:  Past Medical History:  Diagnosis Date  . Anxiety   . Depression   . Elevated cholesterol   . Hormone replacement therapy (HRT)   . 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 Prediabetes 11/20/2019  . RUQ pain 01/11/2013  . Sleep apnea 09/29/2016   Had mild to moderate sleep apnea on home study needs CPAP titration study per Dr Gerilyn Pilgrim     Past Surgical History:  Procedure Laterality Date  . ENDOMETRIAL ABLATION      Family Psychiatric History: ***  Family History:  Family History  Problem Relation Age of Onset  . Hypertension Mother   . Mental illness Sister   . Thyroid cancer Sister   . Hypertension Brother   . Cancer Brother     Social History:   Social History   Socioeconomic History  . Marital status: Divorced    Spouse name: Not on file  . Number of children: Not on file  . Years of education: Not on file  . Highest  education level: Not on file  Occupational History  . Not on file  Tobacco Use  . Smoking status: Current Every Day Smoker    Packs/day: 0.50    Years: 30.00    Pack years: 15.00    Types: Cigarettes  . Smokeless tobacco: Never Used  Vaping Use  . Vaping Use: Never used  Substance and Sexual Activity  . Alcohol use: Yes    Comment: socially  . Drug use: No  . Sexual activity: Yes    Birth control/protection: Surgical    Comment: ablation  Other Topics Concern  . Not on file  Social History Narrative  . Not on file   Social Determinants of Health   Financial Resource Strain: Low Risk   . Difficulty of Paying Living Expenses: Not hard at all  Food Insecurity: No Food Insecurity  . Worried About Programme researcher, broadcasting/film/video in the Last Year: Never true  . Ran Out of Food in the Last Year: Never true  Transportation Needs: No Transportation Needs  . Lack of Transportation (Medical): No  . Lack of Transportation (Non-Medical): No  Physical Activity: Insufficiently Active  . Days of Exercise per Week: 1 day  . Minutes of Exercise per Session: 10 min  Stress: Stress Concern Present  . Feeling of Stress : Rather much  Social Connections: Moderately Isolated  . Frequency of Communication with Friends and Family: More than three times a week  . Frequency of Social Gatherings with Friends and Family:  Twice a week  . Attends Religious Services: 1 to 4 times per year  . Active Member of Clubs or Organizations: No  . Attends Banker Meetings: Never  . Marital Status: Divorced    Additional Social History: ***  Allergies:   Allergies  Allergen Reactions  . Sulfa Antibiotics Nausea And Vomiting    Metabolic Disorder Labs: Lab Results  Component Value Date   HGBA1C 6.2 (H) 11/15/2019   No results found for: PROLACTIN Lab Results  Component Value Date   CHOL 220 (H) 11/15/2019   TRIG 235 (H) 11/15/2019   HDL 38 (L) 11/15/2019   CHOLHDL 5.8 (H) 11/15/2019   VLDL  46 (H) 01/11/2013   LDLCALC 139 (H) 11/15/2019   LDLCALC 89 12/06/2017   Lab Results  Component Value Date   TSH 1.310 11/15/2019    Therapeutic Level Labs: No results found for: LITHIUM No results found for: CBMZ No results found for: VALPROATE  Current Medications: Current Outpatient Medications  Medication Sig Dispense Refill  . busPIRone (BUSPAR) 15 MG tablet TAKE 1 TABLET(15 MG) BY MOUTH TWICE DAILY 60 tablet 6  . cyclobenzaprine (FLEXERIL) 10 MG tablet Take 1 tablet (10 mg total) by mouth 3 (three) times daily as needed for muscle spasms. 30 tablet 2  . hydrOXYzine (ATARAX/VISTARIL) 10 MG tablet Take 1 tablet (10 mg total) by mouth 3 (three) times daily as needed. Take 1 10 mg every 8 hours prn anxiety 45 tablet 1  . ibuprofen (ADVIL,MOTRIN) 200 MG tablet Take 400 mg by mouth as needed.    . simvastatin (ZOCOR) 40 MG tablet Take 40 mg by mouth daily.    Marland Kitchen venlafaxine XR (EFFEXOR-XR) 150 MG 24 hr capsule TAKE 1 CAPSULE(150 MG) BY MOUTH DAILY WITH BREAKFAST 30 capsule 6   No current facility-administered medications for this visit.    Musculoskeletal: Strength & Muscle Tone: N/A Gait & Station: N/A Patient leans: N/A  Psychiatric Specialty Exam: Review of Systems  There were no vitals taken for this visit.There is no height or weight on file to calculate BMI.  General Appearance: {Appearance:22683}  Eye Contact:  {BHH EYE CONTACT:22684}  Speech:  Clear and Coherent  Volume:  Normal  Mood:  {BHH MOOD:22306}  Affect:  {Affect (PAA):22687}  Thought Process:  Coherent  Orientation:  Full (Time, Place, and Person)  Thought Content:  Logical  Suicidal Thoughts:  {ST/HT (PAA):22692}  Homicidal Thoughts:  {ST/HT (PAA):22692}  Memory:  Immediate;   Good  Judgement:  {Judgement (PAA):22694}  Insight:  {Insight (PAA):22695}  Psychomotor Activity:  Normal  Concentration:  Concentration: Good and Attention Span: Good  Recall:  Good  Fund of Knowledge:Good  Language: Good   Akathisia:  No  Handed:  Right  AIMS (if indicated):  not done  Assets:  Communication Skills Desire for Improvement  ADL's:  Intact  Cognition: WNL  Sleep:  {BHH GOOD/FAIR/POOR:22877}   Screenings: GAD-7     Office Visit from 12/21/2019 in [facility] Office Visit from 11/15/2019 in [facility]  Total GAD-7 Score 5 14    PHQ2-9     Office Visit from 12/21/2019 in [facility] Office Visit from 11/15/2019 in [facility] Office Visit from 09/29/2018 in [facility] Visit from 05/24/2018 in [facility] Office Visit from 04/26/2018 in [facility]  PHQ-2 Total Score 2 3 1  0 4  PHQ-9 Total Score 5 10 -- 5 17  Assessment and Plan:    Plan  The patient demonstrates the following risk factors for suicide: Chronic risk factors for suicide include: {Chronic Risk Factors for EXNTZGY:17494496}. Acute risk factors for suicide include: {Acute Risk Factors for PRFFMBW:46659935}. Protective factors for this patient include: {Protective Factors for Suicide TSVX:79390300}. Considering these factors, the overall suicide risk at this point appears to be {Desc; low/moderate/high:110033}. Patient {ACTION; IS/IS PQZ:30076226} appropriate for outpatient follow up.     Neysa Hotter, MD 11/22/20219:56 AM

## 2020-02-01 ENCOUNTER — Telehealth: Payer: 59 | Admitting: Psychiatry

## 2020-02-01 ENCOUNTER — Telehealth: Payer: Self-pay | Admitting: Psychiatry

## 2020-02-01 ENCOUNTER — Other Ambulatory Visit: Payer: Self-pay

## 2020-02-01 NOTE — Telephone Encounter (Signed)
Although she tried to log in to the link, she was unable to do video visit due to technical difficulties. She agrees to reschedule appointment, using her tablet next time.

## 2020-02-01 NOTE — Progress Notes (Deleted)
Psychiatric Initial Adult Assessment   Patient Identification: Sabrina Waters MRN:  [id number] Date of Evaluation:  02/01/2020 Referral Source: *** Chief Complaint:   Visit Diagnosis: No diagnosis found.  History of Present Illness:   Sabrina Waters is a 49 y.o. year old female with a history of depression, anxiety , who presents for follow up appointment for below.           Associated Signs/Symptoms: Depression Symptoms:  {DEPRESSION SYMPTOMS:20000} (Hypo) Manic Symptoms:  {BHH MANIC SYMPTOMS:22872} Anxiety Symptoms:  {BHH ANXIETY SYMPTOMS:22873} Psychotic Symptoms:  {BHH PSYCHOTIC SYMPTOMS:22874} PTSD Symptoms: {BHH PTSD SYMPTOMS:22875}  Past Psychiatric History:  Outpatient:  Psychiatry admission:  Previous suicide attempt:  Past trials of medication:  History of violence:   Previous Psychotropic Medications: {YES/NO:21197}  Substance Abuse History in the last 12 months:  {yes no:314532}  Consequences of Substance Abuse: {BHH CONSEQUENCES OF SUBSTANCE ABUSE:22880}  Past Medical History:  Past Medical History:  Diagnosis Date  . Anxiety   . Depression   . Elevated cholesterol   . Hormone replacement therapy (HRT)   . 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 Prediabetes 11/20/2019  . RUQ pain 01/11/2013  . Sleep apnea 09/29/2016   Had mild to moderate sleep apnea on home study needs CPAP titration study per Dr Gerilyn Pilgrim     Past Surgical History:  Procedure Laterality Date  . ENDOMETRIAL ABLATION      Family Psychiatric History: ***  Family History:  Family History  Problem Relation Age of Onset  . Hypertension Mother   . Mental illness Sister   . Thyroid cancer Sister   . Hypertension Brother   . Cancer Brother     Social History:   Social History   Socioeconomic History  . Marital status: Divorced    Spouse name: Not on file  . Number of children: Not on file  . Years of  education: Not on file  . Highest education level: Not on file  Occupational History  . Not on file  Tobacco Use  . Smoking status: Current Every Day Smoker    Packs/day: 0.50    Years: 30.00    Pack years: 15.00    Types: Cigarettes  . Smokeless tobacco: Never Used  Vaping Use  . Vaping Use: Never used  Substance and Sexual Activity  . Alcohol use: Yes    Comment: socially  . Drug use: No  . Sexual activity: Yes    Birth control/protection: Surgical    Comment: ablation  Other Topics Concern  . Not on file  Social History Narrative  . Not on file   Social Determinants of Health   Financial Resource Strain: Low Risk   . Difficulty of Paying Living Expenses: Not hard at all  Food Insecurity: No Food Insecurity  . Worried About Programme researcher, broadcasting/film/video in the Last Year: Never true  . Ran Out of Food in the Last Year: Never true  Transportation Needs: No Transportation Needs  . Lack of Transportation (Medical): No  . Lack of Transportation (Non-Medical): No  Physical Activity: Insufficiently Active  . Days of Exercise per Week: 1 day  . Minutes of Exercise per Session: 10 min  Stress: Stress Concern Present  . Feeling of Stress : Rather much  Social Connections: Moderately Isolated  . Frequency of Communication with Friends and Family: More than three times a week  . Frequency  of Social Gatherings with Friends and Family: Twice a week  . Attends Religious Services: 1 to 4 times per year  . Active Member of Clubs or Organizations: No  . Attends Banker Meetings: Never  . Marital Status: Divorced    Additional Social History: ***  Allergies:   Allergies  Allergen Reactions  . Sulfa Antibiotics Nausea And Vomiting    Metabolic Disorder Labs: Lab Results  Component Value Date   HGBA1C 6.2 (H) 11/15/2019   No results found for: PROLACTIN Lab Results  Component Value Date   CHOL 220 (H) 11/15/2019   TRIG 235 (H) 11/15/2019   HDL 38 (L) 11/15/2019    CHOLHDL 5.8 (H) 11/15/2019   VLDL 46 (H) 01/11/2013   LDLCALC 139 (H) 11/15/2019   LDLCALC 89 12/06/2017   Lab Results  Component Value Date   TSH 1.310 11/15/2019    Therapeutic Level Labs: No results found for: LITHIUM No results found for: CBMZ No results found for: VALPROATE  Current Medications: Current Outpatient Medications  Medication Sig Dispense Refill  . busPIRone (BUSPAR) 15 MG tablet TAKE 1 TABLET(15 MG) BY MOUTH TWICE DAILY 60 tablet 6  . cyclobenzaprine (FLEXERIL) 10 MG tablet Take 1 tablet (10 mg total) by mouth 3 (three) times daily as needed for muscle spasms. 30 tablet 2  . hydrOXYzine (ATARAX/VISTARIL) 10 MG tablet Take 1 tablet (10 mg total) by mouth 3 (three) times daily as needed. Take 1 10 mg every 8 hours prn anxiety 45 tablet 1  . ibuprofen (ADVIL,MOTRIN) 200 MG tablet Take 400 mg by mouth as needed.    . simvastatin (ZOCOR) 40 MG tablet Take 40 mg by mouth daily.    Marland Kitchen venlafaxine XR (EFFEXOR-XR) 150 MG 24 hr capsule TAKE 1 CAPSULE(150 MG) BY MOUTH DAILY WITH BREAKFAST 30 capsule 6   No current facility-administered medications for this visit.    Musculoskeletal: Strength & Muscle Tone: N/A Gait & Station: N/A Patient leans: N/A  Psychiatric Specialty Exam: Review of Systems  There were no vitals taken for this visit.There is no height or weight on file to calculate BMI.  General Appearance: {Appearance:22683}  Eye Contact:  {BHH EYE CONTACT:22684}  Speech:  Clear and Coherent  Volume:  Normal  Mood:  {BHH MOOD:22306}  Affect:  {Affect (PAA):22687}  Thought Process:  Coherent  Orientation:  Full (Time, Place, and Person)  Thought Content:  Logical  Suicidal Thoughts:  {ST/HT (PAA):22692}  Homicidal Thoughts:  {ST/HT (PAA):22692}  Memory:  Immediate;   Good  Judgement:  {Judgement (PAA):22694}  Insight:  {Insight (PAA):22695}  Psychomotor Activity:  Normal  Concentration:  Concentration: Good and Attention Span: Good  Recall:  Good  Fund  of Knowledge:Good  Language: Good  Akathisia:  No  Handed:  Right  AIMS (if indicated):  not done  Assets:  Communication Skills Desire for Improvement  ADL's:  Intact  Cognition: WNL  Sleep:  {BHH GOOD/FAIR/POOR:22877}   Screenings: GAD-7     Office Visit from 12/21/2019 in [facility] Office Visit from 11/15/2019 in [facility]  Total GAD-7 Score 5 14    PHQ2-9     Office Visit from 12/21/2019 in [facility] Office Visit from 11/15/2019 in [facility] Office Visit from 09/29/2018 in [facility] Office Visit from 05/24/2018 in [facility] Office Visit from 04/26/2018 in [facility]  PHQ-2 Total Score 2 3 1  0 4  PHQ-9 Total  Score 5 10 -- 5 17      Assessment and Plan:    Plan  The patient demonstrates the following risk factors for suicide: Chronic risk factors for suicide include: {Chronic Risk Factors for SHFWYOV:78588502}. Acute risk factors for suicide include: {Acute Risk Factors for DXAJOIN:86767209}. Protective factors for this patient include: {Protective Factors for Suicide OBSJ:62836629}. Considering these factors, the overall suicide risk at this point appears to be {Desc; low/moderate/high:110033}. Patient {ACTION; IS/IS UTM:54650354} appropriate for outpatient follow up.   Neysa Hotter, MD 12/2/202111:33 AM

## 2020-02-06 ENCOUNTER — Other Ambulatory Visit: Payer: Self-pay | Admitting: Adult Health

## 2020-02-07 ENCOUNTER — Telehealth: Payer: 59 | Admitting: Psychiatry

## 2020-02-08 ENCOUNTER — Other Ambulatory Visit: Payer: Self-pay | Admitting: Adult Health

## 2020-02-08 MED ORDER — CEPHALEXIN 500 MG PO CAPS: 500.0000 mg | ORAL_CAPSULE | Freq: Four times a day (QID) | ORAL | 0 refills | Status: DC

## 2020-02-08 NOTE — Progress Notes (Signed)
rx keflex

## 2020-02-15 NOTE — Progress Notes (Deleted)
Psychiatric Initial Adult Assessment   Patient Identification: Sabrina Waters MRN:  [id number] Date of Evaluation:  02/15/2020 Referral Source: *** Chief Complaint:   Visit Diagnosis: No diagnosis found.  History of Present Illness:   Sabrina Waters is a 49 y.o. year old female with a history of depression, anxiety, who is referred for      Associated Signs/Symptoms: Depression Symptoms:  {DEPRESSION SYMPTOMS:20000} (Hypo) Manic Symptoms:  {BHH MANIC SYMPTOMS:22872} Anxiety Symptoms:  {BHH ANXIETY SYMPTOMS:22873} Psychotic Symptoms:  {BHH PSYCHOTIC SYMPTOMS:22874} PTSD Symptoms: {BHH PTSD SYMPTOMS:22875}  Past Psychiatric History:  Outpatient:  Psychiatry admission:  Previous suicide attempt:  Past trials of medication:  History of violence:   Previous Psychotropic Medications: {YES/NO:21197}  Substance Abuse History in the last 12 months:  {yes no:314532}  Consequences of Substance Abuse: {BHH CONSEQUENCES OF SUBSTANCE ABUSE:22880}  Past Medical History:  Past Medical History:  Diagnosis Date  . Anxiety   . Depression   . Elevated cholesterol   . Hormone replacement therapy (HRT)   . 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 Prediabetes 11/20/2019  . RUQ pain 01/11/2013  . Sleep apnea 09/29/2016   Had mild to moderate sleep apnea on home study needs CPAP titration study per Dr Gerilyn Pilgrim     Past Surgical History:  Procedure Laterality Date  . ENDOMETRIAL ABLATION      Family Psychiatric History: ***  Family History:  Family History  Problem Relation Age of Onset  . Hypertension Mother   . Mental illness Sister   . Thyroid cancer Sister   . Hypertension Brother   . Cancer Brother     Social History:   Social History   Socioeconomic History  . Marital status: Divorced    Spouse name: Not on file  . Number of children: Not on file  . Years of education: Not on file  . Highest  education level: Not on file  Occupational History  . Not on file  Tobacco Use  . Smoking status: Current Every Day Smoker    Packs/day: 0.50    Years: 30.00    Pack years: 15.00    Types: Cigarettes  . Smokeless tobacco: Never Used  Vaping Use  . Vaping Use: Never used  Substance and Sexual Activity  . Alcohol use: Yes    Comment: socially  . Drug use: No  . Sexual activity: Yes    Birth control/protection: Surgical    Comment: ablation  Other Topics Concern  . Not on file  Social History Narrative  . Not on file   Social Determinants of Health   Financial Resource Strain: Low Risk   . Difficulty of Paying Living Expenses: Not hard at all  Food Insecurity: No Food Insecurity  . Worried About Programme researcher, broadcasting/film/video in the Last Year: Never true  . Ran Out of Food in the Last Year: Never true  Transportation Needs: No Transportation Needs  . Lack of Transportation (Medical): No  . Lack of Transportation (Non-Medical): No  Physical Activity: Insufficiently Active  . Days of Exercise per Week: 1 day  . Minutes of Exercise per Session: 10 min  Stress: Stress Concern Present  . Feeling of Stress : Rather much  Social Connections: Moderately Isolated  . Frequency of Communication with Friends and Family: More than three times a week  . Frequency of Social Gatherings with Friends and Family: Twice a week  .  Attends Religious Services: 1 to 4 times per year  . Active Member of Clubs or Organizations: No  . Attends Banker Meetings: Never  . Marital Status: Divorced    Additional Social History: ***  Allergies:   Allergies  Allergen Reactions  . Sulfa Antibiotics Nausea And Vomiting    Metabolic Disorder Labs: Lab Results  Component Value Date   HGBA1C 6.2 (H) 11/15/2019   No results found for: PROLACTIN Lab Results  Component Value Date   CHOL 220 (H) 11/15/2019   TRIG 235 (H) 11/15/2019   HDL 38 (L) 11/15/2019   CHOLHDL 5.8 (H) 11/15/2019   VLDL  46 (H) 01/11/2013   LDLCALC 139 (H) 11/15/2019   LDLCALC 89 12/06/2017   Lab Results  Component Value Date   TSH 1.310 11/15/2019    Therapeutic Level Labs: No results found for: LITHIUM No results found for: CBMZ No results found for: VALPROATE  Current Medications: Current Outpatient Medications  Medication Sig Dispense Refill  . busPIRone (BUSPAR) 15 MG tablet TAKE 1 TABLET(15 MG) BY MOUTH TWICE DAILY 60 tablet 6  . cephALEXin (KEFLEX) 500 MG capsule Take 1 capsule (500 mg total) by mouth 4 (four) times daily. 28 capsule 0  . cyclobenzaprine (FLEXERIL) 10 MG tablet Take 1 tablet (10 mg total) by mouth 3 (three) times daily as needed for muscle spasms. 30 tablet 2  . hydrOXYzine (ATARAX/VISTARIL) 10 MG tablet TAKE 1 TABLET BY MOUTH THREE TIMES DAILY AS NEEDED FOR ANXIETY 45 tablet 1  . ibuprofen (ADVIL,MOTRIN) 200 MG tablet Take 400 mg by mouth as needed.    . simvastatin (ZOCOR) 40 MG tablet Take 40 mg by mouth daily.    Marland Kitchen venlafaxine XR (EFFEXOR-XR) 150 MG 24 hr capsule TAKE 1 CAPSULE(150 MG) BY MOUTH DAILY WITH BREAKFAST 30 capsule 6   No current facility-administered medications for this visit.    Musculoskeletal: Strength & Muscle Tone: N/A Gait & Station: N/A Patient leans: N/A  Psychiatric Specialty Exam: Review of Systems  There were no vitals taken for this visit.There is no height or weight on file to calculate BMI.  General Appearance: {Appearance:22683}  Eye Contact:  {BHH EYE CONTACT:22684}  Speech:  Clear and Coherent  Volume:  Normal  Mood:  {BHH MOOD:22306}  Affect:  {Affect (PAA):22687}  Thought Process:  Coherent  Orientation:  Full (Time, Place, and Person)  Thought Content:  Logical  Suicidal Thoughts:  {ST/HT (PAA):22692}  Homicidal Thoughts:  {ST/HT (PAA):22692}  Memory:  Immediate;   Good  Judgement:  {Judgement (PAA):22694}  Insight:  {Insight (PAA):22695}  Psychomotor Activity:  Normal  Concentration:  Concentration: Good and Attention  Span: Good  Recall:  Good  Fund of Knowledge:Good  Language: Good  Akathisia:  No  Handed:  Right  AIMS (if indicated):  not done  Assets:  Communication Skills Desire for Improvement  ADL's:  Intact  Cognition: WNL  Sleep:  {BHH GOOD/FAIR/POOR:22877}   Screenings: GAD-7   Flowsheet Row Office Visit from 12/21/2019 in [facility] Office Visit from 11/15/2019 in [facility]  Total GAD-7 Score 5 14    PHQ2-9   Flowsheet Row Office Visit from 12/21/2019 in [facility] Office Visit from 11/15/2019 in [facility] Office Visit from 09/29/2018 in [facility] Office Visit from 05/24/2018 in [facility] Office Visit from 04/26/2018 in [facility]  PHQ-2 Total Score 2 3 1  0 4  PHQ-9 Total Score  5 10 -- 5 17      Assessment and Plan:  Assessment  Plan  The patient demonstrates the following risk factors for suicide: Chronic risk factors for suicide include: {Chronic Risk Factors for Suicide:30414011}. Acute risk factors for suicide include: {Acute Risk Factors for UJWJXBJ:47829562}. Protective factors for this patient include: {Protective Factors for Suicide ZHYQ:65784696}. Considering these factors, the overall suicide risk at this point appears to be {Desc; low/moderate/high:110033}. Patient {ACTION; IS/IS EXB:28413244} appropriate for outpatient follow up.   Neysa Hotter, MD 12/16/20211:16 PM

## 2020-02-20 ENCOUNTER — Ambulatory Visit: Payer: Managed Care, Other (non HMO) | Admitting: Adult Health

## 2020-02-21 ENCOUNTER — Other Ambulatory Visit: Payer: Self-pay

## 2020-02-21 ENCOUNTER — Telehealth: Payer: 59 | Admitting: Psychiatry

## 2020-03-06 ENCOUNTER — Other Ambulatory Visit: Payer: Self-pay | Admitting: Adult Health

## 2020-03-27 ENCOUNTER — Ambulatory Visit: Payer: Managed Care, Other (non HMO) | Admitting: Adult Health

## 2020-04-09 ENCOUNTER — Other Ambulatory Visit: Payer: Self-pay | Admitting: Adult Health

## 2020-06-27 ENCOUNTER — Other Ambulatory Visit: Payer: Self-pay | Admitting: Adult Health

## 2020-07-29 ENCOUNTER — Other Ambulatory Visit: Payer: Self-pay | Admitting: Adult Health

## 2020-08-28 ENCOUNTER — Other Ambulatory Visit: Payer: Self-pay | Admitting: Adult Health

## 2020-10-29 ENCOUNTER — Other Ambulatory Visit: Payer: Self-pay | Admitting: Adult Health

## 2020-11-26 ENCOUNTER — Other Ambulatory Visit: Payer: Self-pay | Admitting: Adult Health

## 2020-12-31 ENCOUNTER — Other Ambulatory Visit: Payer: Self-pay | Admitting: Adult Health

## 2021-02-04 ENCOUNTER — Other Ambulatory Visit: Payer: Self-pay | Admitting: Adult Health

## 2021-03-07 ENCOUNTER — Other Ambulatory Visit: Payer: Self-pay | Admitting: Adult Health

## 2021-03-28 ENCOUNTER — Other Ambulatory Visit: Payer: Self-pay | Admitting: Adult Health

## 2021-06-10 ENCOUNTER — Other Ambulatory Visit: Payer: Self-pay | Admitting: Adult Health

## 2021-08-06 ENCOUNTER — Other Ambulatory Visit: Payer: Self-pay | Admitting: Adult Health

## 2021-09-30 ENCOUNTER — Ambulatory Visit
Admission: RE | Admit: 2021-09-30 | Discharge: 2021-09-30 | Disposition: A | Discharge status: Home / Self Care | Payer: BC Managed Care – PPO | Source: Ambulatory Visit | Attending: Nurse Practitioner | Admitting: Nurse Practitioner

## 2021-09-30 ENCOUNTER — Ambulatory Visit (INDEPENDENT_AMBULATORY_CARE_PROVIDER_SITE_OTHER): Payer: BC Managed Care – PPO

## 2021-09-30 VITALS — BP 157/98 | HR 114 | Temp 99.0°F | Resp 18

## 2021-09-30 DIAGNOSIS — M25521 Pain in right elbow: Secondary | ICD-10-CM | POA: Diagnosis not present

## 2021-09-30 MED ORDER — METHOCARBAMOL 500 MG PO TABS: 500.0000 mg | ORAL_TABLET | Freq: Two times a day (BID) | ORAL | 0 refills | Status: DC

## 2021-09-30 MED ORDER — IBUPROFEN 800 MG PO TABS: 800.0000 mg | ORAL_TABLET | Freq: Three times a day (TID) | ORAL | 0 refills | Status: DC | PRN

## 2021-09-30 NOTE — ED Triage Notes (Signed)
Right elbow pain that radiates to right shoulder since Wednesday last week.  No known injury

## 2021-09-30 NOTE — ED Provider Notes (Signed)
[facility]    CSN: [id number] Arrival date & time: 09/30/21  1359      History   Chief Complaint Chief Complaint  Patient presents with   Shoulder Injury    Entered by patient    HPI Sabrina Waters is a 51 y.o. female.   The history is provided by the patient.   Patient presents for complaints of right elbow pain that has been present for the past week.  Patient denies any known injury or trauma.  She states pain radiates from the elbow into the right forearm and into the right shoulder.  She states that her pain improves when she is moving the right arm, but that it returns when she stops moving it.Decreased ROM due to pain.   She also states that she has tenderness and soreness along the entire right upper extremity.  Denies swelling, decreased grip strength, or weakness in the right upper arm.  States she has been taking ibuprofen for her symptoms with minimal relief.  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/2019   Prediabetes 11/20/2019   Encounter for screening fecal occult blood testing 11/15/2019   Screening for diabetes mellitus 11/15/2019   Elevated triglycerides with high cholesterol 11/15/2019   Elevated BP without diagnosis of hypertension 11/15/2019   Anxiety and depression 04/26/2018   Tired 04/26/2018   Snores 04/26/2018   Subacute frontal sinusitis 08/24/2017   Screening for colorectal cancer 06/29/2017   Encounter for well woman exam with routine gynecological exam 06/29/2017   Generalized body aches  06/29/2017   Elevated hemoglobin A1c 06/29/2017   Sleep apnea 09/29/2016   Numbness of face 06/17/2015   Complicated migraine 06/17/2015   Hormone replacement therapy (HRT) 01/11/2013   RUQ pain 01/11/2013   Anxiety 09/12/2012   Depression 09/12/2012   Elevated cholesterol 09/12/2012   Fatigue 09/12/2012    Past Surgical History:  Procedure Laterality Date   ENDOMETRIAL ABLATION      OB History     Gravida  1   Para  1   Term  1   Preterm      AB      Living  1      SAB      IAB      Ectopic      Multiple      Live Births  1            Home Medications    Prior to Admission medications   Medication Sig Start Date End Date Taking? Authorizing Provider  ibuprofen (ADVIL) 800 MG tablet Take 1 tablet (800 mg total) by mouth every 8 (eight) hours as needed. 09/30/21  Yes Casandra Dallaire-Warren, Sadie Haber, NP  methocarbamol (ROBAXIN) 500 MG tablet Take 1 tablet (500 mg total) by mouth 2 (two) times daily. 09/30/21  Yes Desere Gwin-Warren, Sadie Haber, NP  busPIRone (BUSPAR) 15 MG tablet TAKE 1 TABLET(15 MG) BY MOUTH TWICE DAILY  02/04/21   Adline Potter, NP  cephALEXin (KEFLEX) 500 MG capsule TAKE 1 CAPSULE(500 MG) BY MOUTH FOUR TIMES DAILY 03/06/20   Cyril Mourning A, NP  cyclobenzaprine (FLEXERIL) 10 MG tablet Take 1 tablet (10 mg total) by mouth 3 (three) times daily as needed for muscle spasms. 05/24/18   Adline Potter, NP  hydrOXYzine (ATARAX) 10 MG tablet TAKE 1 TABLET BY MOUTH THREE TIMES DAILY AS NEEDED FOR ANXIETY 06/11/21   Adline Potter, NP  pravastatin (PRAVACHOL) 40 MG tablet TAKE 1 TABLET(40 MG) BY MOUTH DAILY 08/06/21   Adline Potter, NP  venlafaxine XR (EFFEXOR-XR) 150 MG 24 hr capsule TAKE 1 CAPSULE(150 MG) BY MOUTH DAILY WITH BREAKFAST 03/28/21   Adline Potter, NP    Family History Family History  Problem Relation Age of Onset   Hypertension Mother    Mental illness Sister    Thyroid cancer Sister    Hypertension Brother    Cancer  Brother     Social History Social History   Tobacco Use   Smoking status: Every Day    Packs/day: 0.50    Years: 30.00    Total pack years: 15.00    Types: Cigarettes   Smokeless tobacco: Never  Vaping Use   Vaping Use: Never used  Substance Use Topics   Alcohol use: Yes    Comment: socially   Drug use: No     Allergies   Sulfa antibiotics   Review of Systems Review of Systems Per HPI  Physical Exam Triage Vital Signs ED Triage Vitals  Enc Vitals Group     BP 09/30/21 1414 (!) 157/98     Pulse Rate 09/30/21 1414 (!) 114     Resp 09/30/21 1414 18     Temp 09/30/21 1414 99 F (37.2 C)     Temp Source 09/30/21 1414 Oral     SpO2 09/30/21 1414 94 %     Weight --      Height --      Head Circumference --      Peak Flow --      Pain Score 09/30/21 1415 8     Pain Loc --      Pain Edu? --      Excl. in GC? --    No data found.  Updated Vital Signs BP (!) 157/98 (BP Location: Right Arm)   Pulse (!) 114   Temp 99 F (37.2 C) (Oral)   Resp 18   SpO2 94%   Visual Acuity Right Eye Distance:   Left Eye Distance:   Bilateral Distance:    Right Eye Near:   Left Eye Near:    Bilateral Near:     Physical Exam Vitals and nursing note reviewed.  Constitutional:      General: She is not in acute distress.    Appearance: Normal appearance.  HENT:     Head: Normocephalic.  Cardiovascular:     Rate and Rhythm: Regular rhythm. Tachycardia present.     Pulses: Normal pulses.     Heart sounds: Normal heart sounds.  Pulmonary:     Effort: Pulmonary effort is normal.     Breath sounds: Normal breath sounds.  Abdominal:     General: Bowel sounds are normal.     Palpations: Abdomen is soft.  Musculoskeletal:     Cervical back: Normal range of motion.  Skin:    General: Skin is warm and dry.  Neurological:     General:  No focal deficit present.     Mental Status: She is alert and oriented to person, place, and time.  Psychiatric:        Mood and Affect:  Mood normal.        Behavior: Behavior normal.      UC Treatments / Results  Labs (all labs ordered are listed, but only abnormal results are displayed) Labs Reviewed - No data to display  EKG   Radiology DG Elbow Complete Right  Result Date: 09/30/2021 CLINICAL DATA:  Right elbow pain, no known injury EXAM: RIGHT ELBOW - COMPLETE 3+ VIEW COMPARISON:  None Available. FINDINGS: There is no evidence of fracture, dislocation, or joint effusion. There is no evidence of arthropathy or other focal bone abnormality. Soft tissues are unremarkable. IMPRESSION: No fracture or dislocation of the right elbow. No elbow joint effusion. Electronically Signed   By: Jearld Lesch M.D.   On: 09/30/2021 14:45    Procedures Procedures (including critical care time)  Medications Ordered in UC Medications - No data to display  Initial Impression / Assessment and Plan / UC Course  I have reviewed the triage vital signs and the nursing notes.  Pertinent labs & imaging results that were available during my care of the patient were reviewed by me and considered in my medical decision making (see chart for details).  Patient presents for complaints of right elbow pain that been present over the past week.  There is no known injury or trauma per the patient's report.  On exam, patient has tenderness to the olecranon process.  Patient also complains of pain that radiates into the right forearm and right upper arm and shoulder.  Difficult to discern the patient's origin of pain with plain film only.  X-rays are negative for fracture or dislocation.  Supportive care recommendations were provided to the patient.  Ibuprofen and methocarbamol were provided for pain control.  As discussed with the patient, patient was advised that she will need to follow-up with orthopedics for continued symptoms.  Patient was given the information for [facility] [place] and for [facility] in [place]. Final Clinical  Impressions(s) / UC Diagnoses   Final diagnoses:  Right elbow pain     Discharge Instructions      Your x-rays are negative for fracture or dislocation. Take medication as prescribed. Apply ice to the right elbow to help with pain and swelling.  Apply for 20 minutes, remove for 1 hour, then repeat as needed.  Do this at least 3-4 times daily. Gentle range of motion exercises to improve joint mobility and decrease recovery time. As discussed, if symptoms do not improve over the next week, recommend following up with orthopedics for further evaluation.  You can follow-up with [facility] of [place] at [phone] or [facility] in [place] at [phone]. Follow-up as needed.     ED Prescriptions     Medication Sig Dispense Auth. Provider   ibuprofen (ADVIL) 800 MG tablet Take 1 tablet (800 mg total) by mouth every 8 (eight) hours as needed. 30 tablet Caelan Atchley-Warren, Sadie Haber, NP   methocarbamol (ROBAXIN) 500 MG tablet Take 1 tablet (500 mg total) by mouth 2 (two) times daily. 20 tablet Nishaan Stanke-Warren, Sadie Haber, NP      PDMP not reviewed this encounter.   Abran Cantor, NP 09/30/21 1459

## 2021-09-30 NOTE — Discharge Instructions (Signed)
Your x-rays are negative for fracture or dislocation. Take medication as prescribed. Apply ice to the right elbow to help with pain and swelling.  Apply for 20 minutes, remove for 1 hour, then repeat as needed.  Do this at least 3-4 times daily. Gentle range of motion exercises to improve joint mobility and decrease recovery time. As discussed, if symptoms do not improve over the next week, recommend following up with orthopedics for further evaluation.  You can follow-up with [facility] of [place] at [phone] or [facility] in [place] at [phone]. Follow-up as needed.

## 2021-10-10 ENCOUNTER — Other Ambulatory Visit: Payer: Self-pay | Admitting: Adult Health

## 2021-11-04 ENCOUNTER — Other Ambulatory Visit: Payer: Self-pay | Admitting: Adult Health

## 2021-11-12 ENCOUNTER — Ambulatory Visit: Payer: BC Managed Care – PPO | Admitting: Adult Health

## 2021-11-14 ENCOUNTER — Ambulatory Visit: Payer: BC Managed Care – PPO | Admitting: Adult Health

## 2021-12-12 ENCOUNTER — Other Ambulatory Visit: Payer: Self-pay | Admitting: Adult Health

## 2022-01-01 ENCOUNTER — Ambulatory Visit: Payer: BC Managed Care – PPO | Admitting: Adult Health

## 2022-01-26 ENCOUNTER — Other Ambulatory Visit: Payer: Self-pay | Admitting: Adult Health

## 2022-02-12 ENCOUNTER — Ambulatory Visit: Payer: BC Managed Care – PPO | Admitting: Adult Health

## 2022-02-16 DIAGNOSIS — J069 Acute upper respiratory infection, unspecified: Secondary | ICD-10-CM | POA: Diagnosis not present

## 2022-02-16 DIAGNOSIS — R030 Elevated blood-pressure reading, without diagnosis of hypertension: Secondary | ICD-10-CM | POA: Diagnosis not present

## 2022-02-16 DIAGNOSIS — Z6837 Body mass index (BMI) 37.0-37.9, adult: Secondary | ICD-10-CM | POA: Diagnosis not present

## 2022-02-16 DIAGNOSIS — E669 Obesity, unspecified: Secondary | ICD-10-CM | POA: Diagnosis not present

## 2022-03-30 ENCOUNTER — Ambulatory Visit: Payer: BC Managed Care – PPO | Admitting: Adult Health

## 2022-04-10 ENCOUNTER — Other Ambulatory Visit: Payer: Self-pay | Admitting: Adult Health

## 2022-05-15 ENCOUNTER — Ambulatory Visit: Payer: BC Managed Care – PPO | Admitting: Adult Health

## 2022-05-19 ENCOUNTER — Other Ambulatory Visit: Payer: Self-pay | Admitting: Adult Health

## 2022-07-03 ENCOUNTER — Other Ambulatory Visit (HOSPITAL_COMMUNITY)
Admission: RE | Admit: 2022-07-03 | Discharge: 2022-07-03 | Disposition: A | Discharge status: Home / Self Care | Payer: BC Managed Care – PPO | Source: Ambulatory Visit | Attending: Adult Health | Admitting: Adult Health

## 2022-07-03 ENCOUNTER — Ambulatory Visit (INDEPENDENT_AMBULATORY_CARE_PROVIDER_SITE_OTHER): Payer: BC Managed Care – PPO | Admitting: Adult Health

## 2022-07-03 ENCOUNTER — Other Ambulatory Visit: Payer: Self-pay | Admitting: Adult Health

## 2022-07-03 ENCOUNTER — Encounter: Payer: Self-pay | Admitting: Adult Health

## 2022-07-03 VITALS — BP 127/95 | HR 112 | Ht 64.0 in | Wt 217.0 lb

## 2022-07-03 DIAGNOSIS — R635 Abnormal weight gain: Secondary | ICD-10-CM | POA: Diagnosis not present

## 2022-07-03 DIAGNOSIS — D229 Melanocytic nevi, unspecified: Secondary | ICD-10-CM

## 2022-07-03 DIAGNOSIS — Z1211 Encounter for screening for malignant neoplasm of colon: Secondary | ICD-10-CM

## 2022-07-03 DIAGNOSIS — Z1231 Encounter for screening mammogram for malignant neoplasm of breast: Secondary | ICD-10-CM | POA: Diagnosis not present

## 2022-07-03 DIAGNOSIS — E782 Mixed hyperlipidemia: Secondary | ICD-10-CM | POA: Diagnosis not present

## 2022-07-03 DIAGNOSIS — F419 Anxiety disorder, unspecified: Secondary | ICD-10-CM | POA: Diagnosis not present

## 2022-07-03 DIAGNOSIS — R232 Flushing: Secondary | ICD-10-CM

## 2022-07-03 DIAGNOSIS — F32A Depression, unspecified: Secondary | ICD-10-CM

## 2022-07-03 DIAGNOSIS — R7303 Prediabetes: Secondary | ICD-10-CM | POA: Diagnosis not present

## 2022-07-03 DIAGNOSIS — Z01419 Encounter for gynecological examination (general) (routine) without abnormal findings: Secondary | ICD-10-CM | POA: Diagnosis not present

## 2022-07-03 DIAGNOSIS — Z1212 Encounter for screening for malignant neoplasm of rectum: Secondary | ICD-10-CM

## 2022-07-03 LAB — HEMOCCULT GUIAC POC 1CARD (OFFICE): Fecal Occult Blood, POC: NEGATIVE

## 2022-07-03 MED ORDER — VEOZAH 45 MG PO TABS
1.0000 | ORAL_TABLET | Freq: Every day | ORAL | 6 refills | Status: DC
Start: 2022-07-03 — End: 2022-10-29

## 2022-07-03 NOTE — Progress Notes (Signed)
Patient ID: Sabrina Waters, female   DOB: 1970/10/01, 52 y.o.   MRN: [id number] History of Present Illness: Sabrina Waters is a 52 year old white female, divorced, G1P1001, in for a well woman gyn exam and pap. She is having hot flashes and has gained weight. She and BF broke up.    Current Medications, Allergies, Past Medical History, Past Surgical History, Family History and Social History were reviewed in [employer] record.     Review of Systems: Patient denies any headaches, hearing loss, fatigue, blurred vision, shortness of breath, chest pain, abdominal pain, problems with bowel movements, urination, or intercourse(not active). No joint pain or mood swings.  No vaginal bleeding, had ablation See HPI for positives.   Physical Exam:BP (!) 127/95 (BP Location: Right Arm, Patient Position: Sitting, Cuff Size: Large)   Pulse (!) 112   Ht 5\' 4"  (1.626 m)   Wt 217 lb (98.4 kg)   BMI 37.25 kg/m   General:  Well developed, well nourished, no acute distress Skin:  Warm and dry, has numerous moles and skin tags and AKs Neck:  Midline trachea, normal thyroid, good ROM, no lymphadenopathy Lungs; Clear to auscultation bilaterally Breast:  No dominant palpable mass, retraction, or nipple discharge Cardiovascular: Regular rate and rhythm Abdomen:  Soft, non tender, no hepatosplenomegaly Pelvic:  External genitalia is normal in appearance, no lesions.  The vagina is normal in appearance. Urethra has no lesions or masses. The cervix is smooth, pap with GC/CHL and HR HPV genotyping performed .  Uterus is felt to be normal size, shape, and contour.  No adnexal masses or tenderness noted.Bladder is non tender, no masses felt. Rectal: Good sphincter tone, no polyps, or hemorrhoids felt.  Hemoccult negative. Extremities/musculoskeletal:  No swelling or varicosities noted, no clubbing or cyanosis Psych:  No mood changes, alert and cooperative,seems happy AA is 1 Fall risk is low     07/03/2022    9:46 AM 12/21/2019    9:08 AM 11/15/2019    9:48 AM  Depression screen PHQ 2/9  Decreased Interest 1 1 1   Down, Depressed, Hopeless 1 1 2   PHQ - 2 Score 2 2 3   Altered sleeping 1 1 3   Tired, decreased energy 3 1 3   Change in appetite 3 1 1   Feeling bad or failure about yourself  1 0 0  Trouble concentrating 0 0 0  Moving slowly or fidgety/restless 0 0 0  Suicidal thoughts 0 0 0  PHQ-9 Score 10 5 10   Difficult doing work/chores  Not difficult at all Somewhat difficult       07/03/2022    9:46 AM 12/21/2019    9:09 AM 11/15/2019    9:50 AM  GAD 7 : Generalized Anxiety Score  Nervous, Anxious, on Edge 2 1 2   Control/stop worrying 2 1 3   Worry too much - different things 2 1 3   Trouble relaxing 2 1 3   Restless 1 0 0  Easily annoyed or irritable 3 1 3   Afraid - awful might happen 0 0 0  Total GAD 7 Score 12 5 14   Anxiety Difficulty  Not difficult at all Somewhat difficult      Upstream - 07/03/22 0950       Pregnancy Intention Screening   Does the patient want to become pregnant in the next year? N/A    Does the patient's partner want to become pregnant in the next year? N/A    Would the patient like to discuss contraceptive  options today? N/A      Contraception Wrap Up   Current Method Abstinence    End Method Abstinence    Contraception Counseling Provided No            Examination chaperoned by Malachy Mood LPN   Impression and Plan: 1. Encounter for gynecological examination with Papanicolaou smear of cervix Pap sent Pap in 3 years Physical in 1 year Needs PCP, gave Dr Patsey Berthold info  Will check labs  - Cytology - PAP( [place]) - CBC - Comprehensive metabolic panel - Lipid panel  2. Encounter for screening fecal occult blood testing Hemoccult was negative  - POCT occult blood stool  3. Anxiety and depression She stopped Buspar, is on Effexor 150 mg has refills Will refer to [facility] - Ambulatory referral to Behavioral Health  4. Screening  for colorectal cancer Referred to GI for colonoscopy - Ambulatory referral to Gastroenterology  5. Screening mammogram for breast cancer Mammogram scheduled for 07/15/22 at [facility] at 7:45 am  - MM 3D SCREENING MAMMOGRAM BILATERAL BREAST; Future  6. Prediabetes - Hemoglobin A1c  7. Elevated triglycerides with high cholesterol On Pravachol 40 mg  - Lipid panel  8. Weight gain  - TSH  9. Numerous skin moles See dermatologist, has seen Dr Margo Aye in the past   10. Hot Flashes Will try Veozah, I gave her 21 sample tablets to get started on  Meds ordered this encounter  Medications   Fezolinetant (VEOZAH) 45 MG TABS    Sig: Take 1 tablet (45 mg total) by mouth daily.    Dispense:  30 tablet    Refill:  6    Order Specific Question:   Supervising Provider    Answer:   Duane Lope H [[id number]]   Follow up in 3 months for ROS and check CMP

## 2022-07-04 LAB — CBC
Hematocrit: 48.1 % — ABNORMAL HIGH (ref 34.0–46.6)
Hemoglobin: 16 g/dL — ABNORMAL HIGH (ref 11.1–15.9)
MCH: 30.5 pg (ref 26.6–33.0)
MCHC: 33.3 g/dL (ref 31.5–35.7)
MCV: 92 fL (ref 79–97)
Platelets: 248 10*3/uL (ref 150–450)
RBC: 5.24 x10E6/uL (ref 3.77–5.28)
RDW: 12.1 % (ref 11.7–15.4)
WBC: 6.9 10*3/uL (ref 3.4–10.8)

## 2022-07-04 LAB — COMPREHENSIVE METABOLIC PANEL
ALT: 54 IU/L — ABNORMAL HIGH (ref 0–32)
AST: 37 IU/L (ref 0–40)
Albumin/Globulin Ratio: 1.6 (ref 1.2–2.2)
Albumin: 4.3 g/dL (ref 3.8–4.9)
Alkaline Phosphatase: 87 IU/L (ref 44–121)
BUN/Creatinine Ratio: 11 (ref 9–23)
BUN: 9 mg/dL (ref 6–24)
Bilirubin Total: 0.4 mg/dL (ref 0.0–1.2)
CO2: 26 mmol/L (ref 20–29)
Calcium: 9.8 mg/dL (ref 8.7–10.2)
Chloride: 101 mmol/L (ref 96–106)
Creatinine, Ser: 0.8 mg/dL (ref 0.57–1.00)
Globulin, Total: 2.7 g/dL (ref 1.5–4.5)
Glucose: 110 mg/dL — ABNORMAL HIGH (ref 70–99)
Potassium: 5.2 mmol/L (ref 3.5–5.2)
Sodium: 141 mmol/L (ref 134–144)
Total Protein: 7 g/dL (ref 6.0–8.5)
eGFR: 89 mL/min/{1.73_m2} (ref >59)

## 2022-07-04 LAB — LIPID PANEL
Chol/HDL Ratio: 6.2 ratio — ABNORMAL HIGH (ref 0.0–4.4)
Cholesterol, Total: 241 mg/dL — ABNORMAL HIGH (ref 100–199)
HDL: 39 mg/dL — ABNORMAL LOW (ref >39)
LDL Chol Calc (NIH): 158 mg/dL — ABNORMAL HIGH (ref 0–99)
Triglycerides: 235 mg/dL — ABNORMAL HIGH (ref 0–149)
VLDL Cholesterol Cal: 44 mg/dL — ABNORMAL HIGH (ref 5–40)

## 2022-07-04 LAB — HEMOGLOBIN A1C
Est. average glucose Bld gHb Est-mCnc: 137 mg/dL
Hgb A1c MFr Bld: 6.4 % — ABNORMAL HIGH (ref 4.8–5.6)

## 2022-07-04 LAB — TSH: TSH: 1.44 u[IU]/mL (ref 0.450–4.500)

## 2022-07-06 ENCOUNTER — Other Ambulatory Visit: Payer: Self-pay | Admitting: Adult Health

## 2022-07-06 ENCOUNTER — Encounter: Payer: Self-pay | Admitting: *Deleted

## 2022-07-06 DIAGNOSIS — R7303 Prediabetes: Secondary | ICD-10-CM

## 2022-07-06 DIAGNOSIS — R635 Abnormal weight gain: Secondary | ICD-10-CM

## 2022-07-06 DIAGNOSIS — E782 Mixed hyperlipidemia: Secondary | ICD-10-CM

## 2022-07-06 MED ORDER — PRAVASTATIN SODIUM 80 MG PO TABS: 80.0000 mg | ORAL_TABLET | Freq: Every day | ORAL | 3 refills | Status: DC

## 2022-07-09 LAB — CYTOLOGY - PAP
Adequacy: ABSENT
Chlamydia: NEGATIVE
Comment: NEGATIVE
Comment: NEGATIVE
Comment: NORMAL
Diagnosis: NEGATIVE
High risk HPV: NEGATIVE
Neisseria Gonorrhea: NEGATIVE

## 2022-07-10 ENCOUNTER — Other Ambulatory Visit: Payer: Self-pay | Admitting: Adult Health

## 2022-07-10 MED ORDER — METRONIDAZOLE 500 MG PO TABS: 500.0000 mg | ORAL_TABLET | Freq: Two times a day (BID) | ORAL | 0 refills | Status: DC

## 2022-07-10 NOTE — Progress Notes (Signed)
+  BV on pap will rx flagyl  

## 2022-07-15 ENCOUNTER — Ambulatory Visit (HOSPITAL_COMMUNITY)
Admission: RE | Admit: 2022-07-15 | Discharge: 2022-07-15 | Disposition: A | Discharge status: Home / Self Care | Payer: BC Managed Care – PPO | Source: Ambulatory Visit | Attending: Adult Health | Admitting: Adult Health

## 2022-07-15 DIAGNOSIS — Z1231 Encounter for screening mammogram for malignant neoplasm of breast: Secondary | ICD-10-CM | POA: Diagnosis not present

## 2022-07-16 ENCOUNTER — Ambulatory Visit: Payer: BC Managed Care – PPO | Admitting: Family Medicine

## 2022-07-16 ENCOUNTER — Encounter: Payer: Self-pay | Admitting: Family Medicine

## 2022-07-16 VITALS — BP 136/88 | HR 113 | Ht 64.0 in | Wt 216.0 lb

## 2022-07-16 DIAGNOSIS — Z114 Encounter for screening for human immunodeficiency virus [HIV]: Secondary | ICD-10-CM | POA: Diagnosis not present

## 2022-07-16 DIAGNOSIS — F32A Depression, unspecified: Secondary | ICD-10-CM

## 2022-07-16 DIAGNOSIS — Z1159 Encounter for screening for other viral diseases: Secondary | ICD-10-CM | POA: Diagnosis not present

## 2022-07-16 DIAGNOSIS — F339 Major depressive disorder, recurrent, unspecified: Secondary | ICD-10-CM

## 2022-07-16 DIAGNOSIS — E119 Type 2 diabetes mellitus without complications: Secondary | ICD-10-CM | POA: Insufficient documentation

## 2022-07-16 MED ORDER — METFORMIN HCL 500 MG PO TABS: 500.0000 mg | ORAL_TABLET | Freq: Every day | ORAL | 2 refills | Status: DC

## 2022-07-16 MED ORDER — DULOXETINE HCL 30 MG PO CPEP: 30.0000 mg | ORAL_CAPSULE | Freq: Every day | ORAL | 3 refills | Status: DC

## 2022-07-16 NOTE — Assessment & Plan Note (Signed)
Flowsheet Row Office Visit from 07/16/2022 in [facility]  PHQ-9 Total Score 11     Started on Cymbalta 30 mg daily Continue Effexor 150 mg Discussed about cognitive behavioral therapy focusing on thoughts, belief, and attitudes that affects feelings and behavior, learning about coping skills to deal with certain problems. Maintaining a consistent routine and schedule, Practice stress management and self calming techniques, excersise regularly and spend time outdoors, Do not eat food that are high in fat, added sugar, or salt. Follow up in 4 weeks Referral to behavior health      Patient verbally consented to [facility] services about presenting concerns and psychiatric consultation as appropriate. The services will be billed as appropriate for the patient.

## 2022-07-16 NOTE — Progress Notes (Signed)
New Patient Office Visit   Subjective   Patient ID: Sabrina Waters, female    DOB: [birth year]  Age: 52 y.o. MRN: [id number]  CC:  Chief Complaint  Patient presents with   Establish Care    HPI Sabrina Waters 52 year old female, presents to establish care. 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  Depression      The patient presents with depression.This is a chronic problem.  Patient reports her depression occurs constantly and has been gradually worsening since onset. Associated symptoms include fatigue, irritable, restlessness and decreased interest. Associated symptoms include no headaches. The symptoms are aggravated by family issues.  Past treatments include SNRIs - Serotonin and norepinephrine reuptake inhibitors. Compliance with treatment is good. Previous treatment provided no relief relief. Risk factors include marital problems and major life event. Past medical history includes anxiety and depression.Pertinent negatives include no suicide attempts.       Outpatient Encounter Medications as of 07/16/2022  Medication Sig   DULoxetine (CYMBALTA) 30 MG capsule Take 1 capsule (30 mg total) by mouth daily.   hydrOXYzine (ATARAX) 10 MG tablet TAKE 1 TABLET BY MOUTH THREE TIMES DAILY AS NEEDED FOR ANXIETY   metFORMIN (GLUCOPHAGE) 500 MG tablet Take 1 tablet (500 mg total) by mouth daily with breakfast.   metroNIDAZOLE (FLAGYL) 500 MG tablet Take 1 tablet (500 mg total) by mouth 2 (two) times daily.   pravastatin (PRAVACHOL) 80 MG tablet Take 1 tablet (80 mg total) by mouth daily.   venlafaxine XR (EFFEXOR-XR) 150 MG 24 hr capsule TAKE 1 CAPSULE(150 MG) BY MOUTH DAILY WITH BREAKFAST   cyclobenzaprine (FLEXERIL) 10 MG tablet Take 1 tablet (10 mg total) by mouth 3 (three) times daily as needed for  muscle spasms.   Fezolinetant (VEOZAH) 45 MG TABS Take 1 tablet (45 mg total) by mouth daily. (Patient not taking: Reported on 07/16/2022)   Pseudoephedrine-Ibuprofen 30-200 MG TABS Take by mouth.   No facility-administered encounter medications on file as of 07/16/2022.    Past Surgical History:  Procedure Laterality Date   ENDOMETRIAL ABLATION      Review of Systems  Constitutional:  Negative for chills and fever.  Eyes:  Negative for blurred vision.  Respiratory:  Negative for shortness of breath.   Cardiovascular:  Negative for chest pain and palpitations.  Gastrointestinal:  Negative for abdominal pain and heartburn.  Genitourinary:  Negative for dysuria.  Musculoskeletal:  Negative for myalgias.  Skin:  Negative for itching and rash.  Neurological:  Negative for dizziness and headaches.  Psychiatric/Behavioral:  Positive for depression. The patient is nervous/anxious.       Objective    BP 136/88   Pulse (!) 113   Ht 5\' 4"  (1.626 m)   Wt 216 lb (98 kg)   SpO2 93%   BMI 37.08 kg/m   Physical Exam Vitals reviewed.  Constitutional:      General: She is not in acute distress.    Appearance: Normal appearance. She is not ill-appearing, toxic-appearing or diaphoretic.  HENT:     Head: Normocephalic.  Eyes:     General:        Right eye: No discharge.        Left eye: No discharge.     Conjunctiva/sclera: Conjunctivae normal.  Cardiovascular:     Pulses: Normal pulses.  Heart sounds: Normal heart sounds.  Pulmonary:     Effort: Pulmonary effort is normal. No respiratory distress.     Breath sounds: Normal breath sounds.  Abdominal:     General: Bowel sounds are normal.     Palpations: Abdomen is soft.     Tenderness: There is no abdominal tenderness. There is no right CVA tenderness, left CVA tenderness or guarding.  Musculoskeletal:        General: Normal range of motion.     Cervical back: Normal range of motion.  Skin:    General: Skin is warm and dry.      Capillary Refill: Capillary refill takes less than 2 seconds.  Neurological:     General: No focal deficit present.     Mental Status: She is alert and oriented to person, place, and time.     Coordination: Coordination normal.     Gait: Gait normal.  Psychiatric:        Mood and Affect: Mood normal.        Behavior: Behavior normal.       Assessment & Plan:  Need for hepatitis C screening test -     Hepatitis C antibody  Screening for HIV (human immunodeficiency virus) -     HIV Antibody (routine testing w rflx) -     Hepatitis C antibody  Depression, recurrent (HCC) -     DULoxetine HCl; Take 1 capsule (30 mg total) by mouth daily.  Dispense: 30 capsule; Refill: 3 -     Ambulatory referral to Behavioral Health  Type 2 diabetes mellitus without complication, without long-term current use of insulin (HCC) Assessment & Plan: Hemoglobin A1c 6.4 Started Metformin 500 mg daily Discussed to follow a DASH diet which includes vegetables,fruits,whole grains, fat free or low fat diary,fish,poultry,beans,nuts and seeds,vegetable oils. Find an activity that you will enjoy and start to be active at least 5 days a week for 30 minutes each day.   Orders: -     metFORMIN HCl; Take 1 tablet (500 mg total) by mouth daily with breakfast.  Dispense: 90 tablet; Refill: 2  Depression, unspecified depression type Assessment & Plan: Flowsheet Row Office Visit from 07/16/2022 in [facility]  PHQ-9 Total Score 11     Started on Cymbalta 30 mg daily Continue Effexor 150 mg Discussed about cognitive behavioral therapy focusing on thoughts, belief, and attitudes that affects feelings and behavior, learning about coping skills to deal with certain problems. Maintaining a consistent routine and schedule, Practice stress management and self calming techniques, excersise regularly and spend time outdoors, Do not eat food that are high in fat, added sugar, or salt. Follow up in  4 weeks Referral to behavior health      Patient verbally consented to [facility] services about presenting concerns and psychiatric consultation as appropriate. The services will be billed as appropriate for the patient.       Return in about 4 weeks (around 08/13/2022) for Weight Loss Mangment, Depression.   Cruzita Lederer Newman Nip, FNP

## 2022-07-16 NOTE — Assessment & Plan Note (Signed)
Hemoglobin A1c 6.4 Started Metformin 500 mg daily Discussed to follow a DASH diet which includes vegetables,fruits,whole grains, fat free or low fat diary,fish,poultry,beans,nuts and seeds,vegetable oils. Find an activity that you will enjoy and start to be active at least 5 days a week for 30 minutes each day.

## 2022-07-16 NOTE — Patient Instructions (Signed)

## 2022-07-17 LAB — HEPATITIS C ANTIBODY: Hep C Virus Ab: NONREACTIVE

## 2022-07-17 LAB — HIV ANTIBODY (ROUTINE TESTING W REFLEX): HIV Screen 4th Generation wRfx: NONREACTIVE

## 2022-08-06 ENCOUNTER — Ambulatory Visit (INDEPENDENT_AMBULATORY_CARE_PROVIDER_SITE_OTHER): Payer: BC Managed Care – PPO | Admitting: Professional Counselor

## 2022-08-06 DIAGNOSIS — F331 Major depressive disorder, recurrent, moderate: Secondary | ICD-10-CM

## 2022-08-06 NOTE — Patient Instructions (Addendum)
Develop and more structured routine for sleep.  Decrease screen time before bed.  Increase social activities.   Practice Self Care ie "weekend naps".  If your symptoms worsen or you have thoughts of suicide/homicide, PLEASE SEEK IMMEDIATE MEDICAL ATTENTION.  You may always call:   National Suicide Hotline: 988 or 239-176-5782 [place] Crisis Line: 8305181060 Crisis Recovery in [place]: 9084063700     These are available 24 hours a day, 7 days a week.
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
## 2022-08-13 ENCOUNTER — Ambulatory Visit: Payer: BC Managed Care – PPO | Admitting: Family Medicine

## 2022-08-13 VITALS — BP 131/86 | HR 113 | Ht 64.0 in | Wt 211.0 lb

## 2022-08-13 DIAGNOSIS — J0110 Acute frontal sinusitis, unspecified: Secondary | ICD-10-CM | POA: Diagnosis not present

## 2022-08-13 DIAGNOSIS — E119 Type 2 diabetes mellitus without complications: Secondary | ICD-10-CM

## 2022-08-13 DIAGNOSIS — J019 Acute sinusitis, unspecified: Secondary | ICD-10-CM | POA: Insufficient documentation

## 2022-08-13 DIAGNOSIS — J329 Chronic sinusitis, unspecified: Secondary | ICD-10-CM | POA: Insufficient documentation

## 2022-08-13 MED ORDER — OZEMPIC (0.25 OR 0.5 MG/DOSE) 2 MG/3ML ~~LOC~~ SOPN: 0.5000 mg | PEN_INJECTOR | SUBCUTANEOUS | 0 refills | Status: DC

## 2022-08-13 MED ORDER — AMOXICILLIN-POT CLAVULANATE 875-125 MG PO TABS: 1.0000 | ORAL_TABLET | Freq: Two times a day (BID) | ORAL | 0 refills | Status: AC

## 2022-08-13 MED ORDER — DULOXETINE HCL 60 MG PO CPEP: 60.0000 mg | ORAL_CAPSULE | Freq: Every day | ORAL | 3 refills | Status: DC

## 2022-08-13 MED ORDER — BENZONATATE 100 MG PO CAPS: 100.0000 mg | ORAL_CAPSULE | Freq: Two times a day (BID) | ORAL | 0 refills | Status: DC | PRN

## 2022-08-13 NOTE — Patient Instructions (Signed)
        Great to see you today.   - Please take medications as prescribed. - Follow up with your primary health provider if any health concerns arises. - If symptoms worsen please contact your primary care provider and/or visit the emergency department.  

## 2022-08-13 NOTE — Progress Notes (Signed)
New Patient Office Visit   Subjective   Patient ID: Sabrina Waters, female    DOB: [birth year]  Age: 52 y.o. MRN: [id number]  CC:  Chief Complaint  Patient presents with   Weight Loss   Depression    Patient is here for weight loss f/u. No changes or concerns since last visit.     HPI Sabrina Waters 52 year old female, presents to the clinic for facial pain and productive cough started 5 days ago. 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  Patient complains of  productive cough. Patient describes symptoms of cough and green sputum production. Facial pain, Left ear pain. Symptoms began 5 days ago and are unchanged since that time. Patient denies chest pain or nausea and vomiting. Treatment thus far includes OTC analgesics/antipyretics: somewhat effective Past pulmonary history is significant for no history of pneumonia or bronchitis       Outpatient Encounter Medications as of 08/13/2022  Medication Sig   amoxicillin-clavulanate (AUGMENTIN) 875-125 MG tablet Take 1 tablet by mouth 2 (two) times daily for 7 days.   benzonatate (TESSALON) 100 MG capsule Take 1 capsule (100 mg total) by mouth 2 (two) times daily as needed for cough.   cyclobenzaprine (FLEXERIL) 10 MG tablet Take 1 tablet (10 mg total) by mouth 3 (three) times daily as needed for muscle spasms.   DULoxetine (CYMBALTA) 60 MG capsule Take 1 capsule (60 mg total) by mouth daily.   Fezolinetant (VEOZAH) 45 MG TABS Take 1 tablet (45 mg total) by mouth daily.   hydrOXYzine (ATARAX) 10 MG tablet TAKE 1 TABLET BY MOUTH THREE TIMES DAILY AS NEEDED FOR ANXIETY   metFORMIN (GLUCOPHAGE) 500 MG tablet Take 1 tablet (500 mg total) by mouth daily with breakfast.   pravastatin (PRAVACHOL) 80 MG tablet Take 1 tablet (80 mg total) by mouth daily.    Pseudoephedrine-Ibuprofen 30-200 MG TABS Take by mouth.   Semaglutide,0.25 or 0.5MG /DOS, (OZEMPIC, 0.25 OR 0.5 MG/DOSE,) 2 MG/3ML SOPN Inject 0.5 mg into the skin once a week.   venlafaxine XR (EFFEXOR-XR) 150 MG 24 hr capsule TAKE 1 CAPSULE(150 MG) BY MOUTH DAILY WITH BREAKFAST   [DISCONTINUED] DULoxetine (CYMBALTA) 30 MG capsule Take 1 capsule (30 mg total) by mouth daily.   [DISCONTINUED] metroNIDAZOLE (FLAGYL) 500 MG tablet Take 1 tablet (500 mg total) by mouth 2 (two) times daily.   No facility-administered encounter medications on file as of 08/13/2022.    Past Surgical History:  Procedure Laterality Date   ENDOMETRIAL ABLATION      Review of Systems  Constitutional:  Negative for chills and fever.  HENT:  Positive for congestion, ear pain, sinus pain and sore throat.   Eyes:  Negative for blurred vision.  Respiratory:  Positive for cough and sputum production. Negative for shortness of breath.   Cardiovascular:  Negative for chest pain.  Gastrointestinal:  Negative for abdominal pain.  Genitourinary:  Negative for dysuria.  Neurological:  Negative for dizziness and headaches.      Objective    BP 131/86   Pulse (!) 113   Ht 5\' 4"  (1.626 m)   Wt 211 lb (95.7 kg)   SpO2 94%   BMI 36.22 kg/m   Physical Exam Vitals reviewed.  Constitutional:      General: She is not in acute distress.    Appearance:  Normal appearance. She is not ill-appearing, toxic-appearing or diaphoretic.  HENT:     Head: Normocephalic.     Left Ear: Tympanic membrane is not erythematous.  Eyes:     General:        Right eye: No discharge.        Left eye: No discharge.     Conjunctiva/sclera: Conjunctivae normal.  Cardiovascular:     Rate and Rhythm: Normal rate.     Pulses: Normal pulses.     Heart sounds: Normal heart sounds.  Pulmonary:     Breath sounds: Rhonchi present.  Abdominal:     General: Bowel sounds are normal.     Palpations: Abdomen is soft.  Musculoskeletal:         General: Normal range of motion.     Cervical back: Normal range of motion.  Skin:    General: Skin is warm and dry.     Capillary Refill: Capillary refill takes less than 2 seconds.  Neurological:     General: No focal deficit present.     Mental Status: She is alert and oriented to person, place, and time.     Coordination: Coordination normal.     Gait: Gait normal.  Psychiatric:        Mood and Affect: Mood normal.        Behavior: Behavior normal.       Assessment & Plan:  Type 2 diabetes mellitus without complication, without long-term current use of insulin (HCC) -     Ozempic (0.25 or 0.5 MG/DOSE); Inject 0.5 mg into the skin once a week.  Dispense: 3 mL; Refill: 0  Acute frontal sinusitis, recurrence not specified Assessment & Plan: Facial pain and tenderness when palpated  Started Augmentin 875-125 mg and Benzonatate 100 mg PRN for cough Discussed Symptomatic treatment, rest, increase oral fluid intake. Take OTC tylenol for fever or pain medications. Follow-up for worsening or persistent symptoms.Patient verbalizes understanding regarding plan of care and all questions answered   Orders: -     Amoxicillin-Pot Clavulanate; Take 1 tablet by mouth 2 (two) times daily for 7 days.  Dispense: 14 tablet; Refill: 0  Other orders -     DULoxetine HCl; Take 1 capsule (60 mg total) by mouth daily.  Dispense: 30 capsule; Refill: 3 -     Benzonatate; Take 1 capsule (100 mg total) by mouth 2 (two) times daily as needed for cough.  Dispense: 20 capsule; Refill: 0    Return in about 3 months (around 11/13/2022), or if symptoms worsen or fail to improve, for diabetes.   Cruzita Lederer Newman Nip, FNP

## 2022-08-13 NOTE — Assessment & Plan Note (Signed)
Facial pain and tenderness when palpated  Started Augmentin 875-125 mg and Benzonatate 100 mg PRN for cough Discussed Symptomatic treatment, rest, increase oral fluid intake. Take OTC tylenol for fever or pain medications. Follow-up for worsening or persistent symptoms.Patient verbalizes understanding regarding plan of care and all questions answered

## 2022-08-17 ENCOUNTER — Ambulatory Visit (HOSPITAL_COMMUNITY): Payer: BC Managed Care – PPO | Admitting: Psychiatry

## 2022-08-17 ENCOUNTER — Telehealth: Payer: Self-pay | Admitting: Family Medicine

## 2022-08-17 NOTE — Telephone Encounter (Signed)
Patient called in regard to [facility]  Pre auth needs supporting documents

## 2022-08-19 ENCOUNTER — Telehealth (INDEPENDENT_AMBULATORY_CARE_PROVIDER_SITE_OTHER): Payer: BC Managed Care – PPO | Admitting: Professional Counselor

## 2022-08-19 DIAGNOSIS — F331 Major depressive disorder, recurrent, moderate: Secondary | ICD-10-CM

## 2022-08-19 NOTE — BH Specialist Note (Signed)
Virtual Behavioral Health Treatment Plan Team Note  MRN: [id number] NAME: Sabrina Waters  DATE: 08/19/22  Start time: Start Time: 0312 End time:   Total time:    Total number of Virtual BH Treatment Team Plan encounters: 1/4  Treatment Team Attendees: Dr. Vanetta Shawl and Esmond Harps  Diagnoses: No diagnosis found.  Goals, Interventions and Follow-up Plan Goals: *** Interventions: Mindfulness or Relaxation Training Behavioral Activation Medication Management Recommendations: *** Follow-up Plan: Bi-weekly counseling.  History of the present illness Presenting Problem/Current Symptoms: ***  Psychiatric History  Depression: {BHH YES OR NO:22294} Anxiety: {BHH YES OR NO:22294} Mania: {BHH YES OR NO:22294} Psychosis: {BHH YES OR NO:22294} PTSD symptoms: {BHH YES OR NO:22294}  Past Psychiatric History/Hospitalization(s): Hospitalization for psychiatric illness: {BHH YES OR NO:22294} Prior Suicide Attempts: {BHH YES OR NO:22294} Prior Self-injurious behavior: {BHH YES OR WU:98119}  Psychosocial stressors   Self-harm Behaviors Risk Assessment   Screenings PHQ-9 Assessments:     08/13/2022    9:07 AM 08/06/2022   10:51 AM 07/16/2022    4:04 PM  Depression screen PHQ 2/9  Decreased Interest 1 2 2   Down, Depressed, Hopeless 1 2 2   PHQ - 2 Score 2 4 4   Altered sleeping 2 2 3   Tired, decreased energy 1 2 2   Change in appetite 2 2 2   Feeling bad or failure about yourself  0 1 0  Trouble concentrating 0 1 0  Moving slowly or fidgety/restless 0 0 0  Suicidal thoughts 0 0 0  PHQ-9 Score 7 12 11   Difficult doing work/chores Somewhat difficult Somewhat difficult Somewhat difficult   GAD-7 Assessments:     08/13/2022    9:07 AM 08/06/2022   10:53 AM 07/16/2022    4:04 PM 07/03/2022    9:46 AM  GAD 7 : Generalized Anxiety Score  Nervous, Anxious, on Edge 1 2 2 2   Control/stop worrying 1 1 2 2   Worry too much - different things 1 2 2 2   Trouble relaxing 1 2 1 2   Restless 0 1 1 1    Easily annoyed or irritable 1 2 2 3   Afraid - awful might happen 0 0 0 0  Total GAD 7 Score 5 10 10 12   Anxiety Difficulty Not difficult at all  Somewhat difficult     Past Medical History Past Medical History:  Diagnosis Date   Anxiety    Depression    Elevated cholesterol    Hormone replacement therapy (HRT)    LGSIL on Pap smear of cervix 11/20/2019   HPV negative, repeat in 3 years per [facility], CIN 3+risk is 0.25 %   Prediabetes 11/20/2019   RUQ pain 01/11/2013   Sleep apnea 09/29/2016   Had mild to moderate sleep apnea on home study needs CPAP titration study per Dr Gerilyn Pilgrim     Vital signs: There were no vitals filed for this visit.  Allergies:  Allergies as of 08/19/2022 - Review Complete 08/13/2022  Allergen Reaction Noted   Sulfa antibiotics Nausea And Vomiting 09/12/2012    Medication History Current medications:  Outpatient Encounter Medications as of 08/19/2022  Medication Sig   amoxicillin-clavulanate (AUGMENTIN) 875-125 MG tablet Take 1 tablet by mouth 2 (two) times daily for 7 days.   benzonatate (TESSALON) 100 MG capsule Take 1 capsule (100 mg total) by mouth 2 (two) times daily as needed for cough.   cyclobenzaprine (FLEXERIL) 10 MG tablet Take 1 tablet (10 mg total) by mouth 3 (three) times daily as needed for muscle spasms.  DULoxetine (CYMBALTA) 60 MG capsule Take 1 capsule (60 mg total) by mouth daily.   Fezolinetant (VEOZAH) 45 MG TABS Take 1 tablet (45 mg total) by mouth daily.   hydrOXYzine (ATARAX) 10 MG tablet TAKE 1 TABLET BY MOUTH THREE TIMES DAILY AS NEEDED FOR ANXIETY   metFORMIN (GLUCOPHAGE) 500 MG tablet Take 1 tablet (500 mg total) by mouth daily with breakfast.   pravastatin (PRAVACHOL) 80 MG tablet Take 1 tablet (80 mg total) by mouth daily.   Pseudoephedrine-Ibuprofen 30-200 MG TABS Take by mouth.   Semaglutide,0.25 or 0.5MG /DOS, (OZEMPIC, 0.25 OR 0.5 MG/DOSE,) 2 MG/3ML SOPN Inject 0.5 mg into the skin once a week.   venlafaxine XR  (EFFEXOR-XR) 150 MG 24 hr capsule TAKE 1 CAPSULE(150 MG) BY MOUTH DAILY WITH BREAKFAST   No facility-administered encounter medications on file as of 08/19/2022.     Scribe for Treatment Team: Reuel Boom

## 2022-08-24 ENCOUNTER — Encounter: Payer: Self-pay | Admitting: Family Medicine

## 2022-08-26 NOTE — Telephone Encounter (Signed)
Prior auth has been started

## 2022-08-26 NOTE — Telephone Encounter (Signed)
Prior Berkley Harvey has been started, takes 24-72 hours to hear back from insurance, my chart message was also sent on this.

## 2022-08-27 ENCOUNTER — Ambulatory Visit (INDEPENDENT_AMBULATORY_CARE_PROVIDER_SITE_OTHER): Payer: BC Managed Care – PPO | Admitting: Professional Counselor

## 2022-08-27 DIAGNOSIS — F331 Major depressive disorder, recurrent, moderate: Secondary | ICD-10-CM

## 2022-08-27 NOTE — BH Specialist Note (Signed)
[place] Virtual BH Telephone Follow-up  MRN: [id number] NAME: Alleyna Bolthouse Creque Date: 08/27/22  Start time: Start Time: 0300 End time: Stop Time: 0330 Total time: Total Time in Minutes (Visit): 30 Call number: Visit Number: 3- Third Visit  Reason for call today:  Patient is a 52 y.o. female presenting for collaborative care follow up. Patient reports improved mood explaining that she is feeling much better after her increase in Cymbalta. Patient reports increased social activities which has helped her mood as well. Patient reports she is continuing to detach from her ex and explains that he reached out and she turned him down. She reports this is an improvement for her as she typically gives in to his request. [facility] encouraged patient to explore her values and boundaries as she tries to navigate the relationship with her ex. Patient reports continues to struggle falling asleep but once she is asleep she sleeps approximately 6hrs. Appetite is good. Patient will follow up in two weeks.   PHQ-9 Scores:     08/27/2022    3:02 PM 08/13/2022    9:07 AM 08/06/2022   10:51 AM 07/16/2022    4:04 PM 07/03/2022    9:46 AM  Depression screen PHQ 2/9  Decreased Interest 1 1 2 2 1   Down, Depressed, Hopeless 1 1 2 2 1   PHQ - 2 Score 2 2 4 4 2   Altered sleeping 2 2 2 3 1   Tired, decreased energy 0 1 2 2 3   Change in appetite 1 2 2 2 3   Feeling bad or failure about yourself  0 0 1 0 1  Trouble concentrating 0 0 1 0 0  Moving slowly or fidgety/restless 0 0 0 0 0  Suicidal thoughts 0 0 0 0 0  PHQ-9 Score 5 7 12 11 10   Difficult doing work/chores  Somewhat difficult Somewhat difficult Somewhat difficult    GAD-7 Scores:     08/27/2022    3:06 PM 08/13/2022    9:07 AM 08/06/2022   10:53 AM 07/16/2022    4:04 PM  GAD 7 : Generalized Anxiety Score  Nervous, Anxious, on Edge 1 1 2 2   Control/stop worrying 0 1 1 2   Worry too much - different things 0 1 2 2   Trouble relaxing 0 1 2 1   Restless 0 0 1 1  Easily  annoyed or irritable 0 1 2 2   Afraid - awful might happen 0 0 0 0  Total GAD 7 Score 1 5 10 10   Anxiety Difficulty  Not difficult at all  Somewhat difficult    Stress Current stressors:  New job role Sleep:  Patient reports trouble falling asleep Appetite:  Good Coping ability:  Good Patient taking medications as prescribed:  PCP upped dosage of Cymbalta. Feels positive effects from increase.  Current medications:  Outpatient Encounter Medications as of 08/27/2022  Medication Sig   benzonatate (TESSALON) 100 MG capsule Take 1 capsule (100 mg total) by mouth 2 (two) times daily as needed for cough.   cyclobenzaprine (FLEXERIL) 10 MG tablet Take 1 tablet (10 mg total) by mouth 3 (three) times daily as needed for muscle spasms.   DULoxetine (CYMBALTA) 60 MG capsule Take 1 capsule (60 mg total) by mouth daily.   Fezolinetant (VEOZAH) 45 MG TABS Take 1 tablet (45 mg total) by mouth daily.   hydrOXYzine (ATARAX) 10 MG tablet TAKE 1 TABLET BY MOUTH THREE TIMES DAILY AS NEEDED FOR ANXIETY   metFORMIN (GLUCOPHAGE) 500 MG tablet Take  1 tablet (500 mg total) by mouth daily with breakfast.   pravastatin (PRAVACHOL) 80 MG tablet Take 1 tablet (80 mg total) by mouth daily.   Pseudoephedrine-Ibuprofen 30-200 MG TABS Take by mouth.   Semaglutide,0.25 or 0.5MG /DOS, (OZEMPIC, 0.25 OR 0.5 MG/DOSE,) 2 MG/3ML SOPN Inject 0.5 mg into the skin once a week.   venlafaxine XR (EFFEXOR-XR) 150 MG 24 hr capsule TAKE 1 CAPSULE(150 MG) BY MOUTH DAILY WITH BREAKFAST   No facility-administered encounter medications on file as of 08/27/2022.     Self-harm Behaviors Risk Assessment Self-harm risk factors:  Low Risk Patient endorses recent thoughts of harming self:  Denies   Danger to Others Risk Assessment Danger to others risk factors:  Low Risk Patient endorses recent thoughts of harming others:  Denies   Substance Use Assessment Patient recently consumed alcohol:  Last week   Alcohol Use Disorder  Identification Test (AUDIT):     11/15/2019    9:51 AM 07/03/2022    9:47 AM 08/06/2022   11:03 AM 08/13/2022    8:18 AM  Alcohol Use Disorder Test (AUDIT)  1. How often do you have a drink containing alcohol? 2 1 1 1   2. How many drinks containing alcohol do you have on a typical day when you are drinking? 0 0 2 0  3. How often do you have six or more drinks on one occasion? 0 0 0 0  AUDIT-C Score 2 1 3 1   4. How often during the last year have you found that you were not able to stop drinking once you had started?   0   5. How often during the last year have you failed to do what was normally expected from you because of drinking?   0   6. How often during the last year have you needed a first drink in the morning to get yourself going after a heavy drinking session?   0   7. How often during the last year have you had a feeling of guilt of remorse after drinking?   0   8. How often during the last year have you been unable to remember what happened the night before because you had been drinking?   0   9. Have you or someone else been injured as a result of your drinking?   0   10. Has a relative or friend or a doctor or another health worker been concerned about your drinking or suggested you cut down?   0   Alcohol Use Disorder Identification Test Final Score (AUDIT)   3    Goals, Interventions and Follow-up Plan Goals: Increase healthy adjustment to current life circumstances Interventions: Mindfulness or Relaxation Training and Behavioral Activation Follow-up Plan:  Continue biweekly counseling  Reuel Boom

## 2022-08-28 NOTE — Patient Instructions (Addendum)
Continue self care plan.   Explore values and healthy boundaries.  https://www.therapistaid.com/worksheets/healthy-boundaries-tips  https://www.therapistaid.com/therapy-worksheet/exploring-values

## 2022-09-08 ENCOUNTER — Telehealth: Payer: Self-pay | Admitting: Family Medicine

## 2022-09-08 NOTE — Telephone Encounter (Signed)
Malachi Bonds w. [employer] called in on patient behalf.  Needs new request submitted through [facility] or fax for Ozempic.  Last request was incomplete so it has been closed.  Call back number [phone] opt [phone]

## 2022-09-09 NOTE — Telephone Encounter (Signed)
Spoke to [employer]. They are faxing a form to be filled out.

## 2022-10-01 ENCOUNTER — Other Ambulatory Visit: Payer: Self-pay | Admitting: Family Medicine

## 2022-10-01 MED ORDER — SEMAGLUTIDE (1 MG/DOSE) 4 MG/3ML ~~LOC~~ SOPN: 1.0000 mg | PEN_INJECTOR | SUBCUTANEOUS | 0 refills | Status: DC

## 2022-10-02 ENCOUNTER — Telehealth: Payer: Self-pay | Admitting: Professional Counselor

## 2022-10-02 DIAGNOSIS — F331 Major depressive disorder, recurrent, moderate: Secondary | ICD-10-CM

## 2022-10-02 NOTE — BH Specialist Note (Signed)
[facility] reached out to patient to schedule follow up visit for collaborative care. No answer. Left voicemail.

## 2022-10-06 ENCOUNTER — Ambulatory Visit: Payer: BC Managed Care – PPO | Admitting: Adult Health

## 2022-10-23 ENCOUNTER — Ambulatory Visit (INDEPENDENT_AMBULATORY_CARE_PROVIDER_SITE_OTHER): Payer: BC Managed Care – PPO | Admitting: Professional Counselor

## 2022-10-23 ENCOUNTER — Encounter: Payer: Self-pay | Admitting: Family Medicine

## 2022-10-23 ENCOUNTER — Other Ambulatory Visit: Payer: Self-pay | Admitting: Family Medicine

## 2022-10-23 DIAGNOSIS — F331 Major depressive disorder, recurrent, moderate: Secondary | ICD-10-CM

## 2022-10-23 MED ORDER — SEMAGLUTIDE (2 MG/DOSE) 8 MG/3ML ~~LOC~~ SOPN: 2.0000 mg | PEN_INJECTOR | SUBCUTANEOUS | 3 refills | Status: DC

## 2022-10-23 NOTE — BH Specialist Note (Unsigned)
[place] [facility] Follow-up  MRN: [id number] NAME: Afshan Merrick Burridge Date: 10/23/22  Start time: Start Time: 0300 End time: Stop Time: 0345 Total time: Total Time in Minutes (Visit): 45 Call number: Visit Number: 4- Fourth Visit  Reason for call today:  The patient is a 52 year old female returning for a collaborative care follow-up one month after her last session. She reports continued stress from her new role at work, stating that although her recent promotion was positive, it has come with increased demands that have been stressful. She describes feeling low energy and a loss of interest in activities, mentioning that she primarily works and goes home, with limited social life or dating.  Regarding her medications, the patient reports that Cymbalta was working well for a while, but since starting Ozempic, she has experienced irritability and decreased appetite. She feels that the effectiveness of Cymbalta has diminished since beginning Ozempic and is interested in discussing possible adjustments to her medication regimen. She also continues to struggle with sleep, experiencing difficulty falling asleep and waking up early for work, leading to inadequate rest. The patient is considering a sleep aid, such as Trazodone, to help improve her sleep quality.  Her goals include becoming more active, engaging more with friends, potentially finding a meaningful relationship, and improving her sleep. I will follow up with the psychiatric consultant and the patient's primary care provider to discuss potential adjustments to her medications and consider the use of a sleep aid to address her concerns. A follow-up appointment will be scheduled to evaluate the effectiveness of any new interventions and continue supporting the patient in managing her stress and enhancing her overall well-being.  PHQ-9 Scores:     10/23/2022    3:15 PM 08/27/2022    3:02 PM 08/13/2022    9:07 AM 08/06/2022   10:51 AM 07/16/2022    4:04  PM  Depression screen PHQ 2/9  Decreased Interest 1 1 1 2 2   Down, Depressed, Hopeless 1 1 1 2 2   PHQ - 2 Score 2 2 2 4 4   Altered sleeping 2 2 2 2 3   Tired, decreased energy 2 0 1 2 2   Change in appetite 2 1 2 2 2   Feeling bad or failure about yourself  1 0 0 1 0  Trouble concentrating 1 0 0 1 0  Moving slowly or fidgety/restless 0 0 0 0 0  Suicidal thoughts 0 0 0 0 0  PHQ-9 Score 10 5 7 12 11   Difficult doing work/chores   Somewhat difficult Somewhat difficult Somewhat difficult   GAD-7 Scores:     10/23/2022    3:16 PM 08/27/2022    3:06 PM 08/13/2022    9:07 AM 08/06/2022   10:53 AM  GAD 7 : Generalized Anxiety Score  Nervous, Anxious, on Edge 1 1 1 2   Control/stop worrying 0 0 1 1  Worry too much - different things 0 0 1 2  Trouble relaxing 1 0 1 2  Restless 0 0 0 1  Easily annoyed or irritable 2 0 1 2  Afraid - awful might happen 0 0 0 0  Total GAD 7 Score 4 1 5 10   Anxiety Difficulty Somewhat difficult  Not difficult at all     Stress Current stressors:  Work Sleep:  Not sleeping good.  Appetite:  Decreased Coping ability:  Good Patient taking medications as prescribed:  Yes  Current medications:  Outpatient Encounter Medications as of 10/23/2022  Medication Sig   benzonatate (TESSALON)  100 MG capsule Take 1 capsule (100 mg total) by mouth 2 (two) times daily as needed for cough.   cyclobenzaprine (FLEXERIL) 10 MG tablet Take 1 tablet (10 mg total) by mouth 3 (three) times daily as needed for muscle spasms.   DULoxetine (CYMBALTA) 60 MG capsule Take 1 capsule (60 mg total) by mouth daily.   Fezolinetant (VEOZAH) 45 MG TABS Take 1 tablet (45 mg total) by mouth daily.   hydrOXYzine (ATARAX) 10 MG tablet TAKE 1 TABLET BY MOUTH THREE TIMES DAILY AS NEEDED FOR ANXIETY   metFORMIN (GLUCOPHAGE) 500 MG tablet Take 1 tablet (500 mg total) by mouth daily with breakfast.   pravastatin (PRAVACHOL) 80 MG tablet Take 1 tablet (80 mg total) by mouth daily.    Pseudoephedrine-Ibuprofen 30-200 MG TABS Take by mouth.   Semaglutide, 2 MG/DOSE, 8 MG/3ML SOPN Inject 2 mg as directed once a week.   venlafaxine XR (EFFEXOR-XR) 150 MG 24 hr capsule TAKE 1 CAPSULE(150 MG) BY MOUTH DAILY WITH BREAKFAST   No facility-administered encounter medications on file as of 10/23/2022.     Self-harm Behaviors Risk Assessment Self-harm risk factors:  Low risk Patient endorses recent thoughts of harming self:  denies   Danger to Others Risk Assessment Danger to others risk factors:  Low Patient endorses recent thoughts of harming others:  Denies  Dynamic Appraisal of Situational Aggression (DASA):      No data to display           Substance Use Assessment Patient recently consumed alcohol:    Alcohol Use Disorder Identification Test (AUDIT):     11/15/2019    9:51 AM 07/03/2022    9:47 AM 08/06/2022   11:03 AM 08/13/2022    8:18 AM  Alcohol Use Disorder Test (AUDIT)  1. How often do you have a drink containing alcohol? 2 1 1 1   2. How many drinks containing alcohol do you have on a typical day when you are drinking? 0 0 2 0  3. How often do you have six or more drinks on one occasion? 0 0 0 0  AUDIT-C Score 2 1 3 1   4. How often during the last year have you found that you were not able to stop drinking once you had started?   0   5. How often during the last year have you failed to do what was normally expected from you because of drinking?   0   6. How often during the last year have you needed a first drink in the morning to get yourself going after a heavy drinking session?   0   7. How often during the last year have you had a feeling of guilt of remorse after drinking?   0   8. How often during the last year have you been unable to remember what happened the night before because you had been drinking?   0   9. Have you or someone else been injured as a result of your drinking?   0   10. Has a relative or friend or a doctor or another health worker  been concerned about your drinking or suggested you cut down?   0   Alcohol Use Disorder Identification Test Final Score (AUDIT)   3     Goals, Interventions and Follow-up Plan Goals: Her goals include becoming more active, engaging more with friends, potentially finding a meaningful relationship, and improving her sleep. Interventions: Behavioral Activation and CBT Cognitive Behavioral Therapy Follow-up Plan:  2 week follow up  Reuel Boom

## 2022-10-28 ENCOUNTER — Telehealth (INDEPENDENT_AMBULATORY_CARE_PROVIDER_SITE_OTHER): Payer: BC Managed Care – PPO | Admitting: Professional Counselor

## 2022-10-28 DIAGNOSIS — F331 Major depressive disorder, recurrent, moderate: Secondary | ICD-10-CM | POA: Diagnosis not present

## 2022-10-28 NOTE — BH Specialist Note (Signed)
[facility] Treatment Plan Team Note  MRN: [id number] NAME: Sabrina Waters  DATE: 10/29/22  Start time: Start Time: 0420 End time: Stop Time: 0430 Total time: Total Time in Minutes (Visit): 10  Total number of Virtual BH Treatment Team Plan encounters: 2/4  Treatment Team Attendees: Dr. Vanetta Shawl and Esmond Harps  Diagnoses:    ICD-10-CM   1. Moderate episode of recurrent major depressive disorder (HCC)  F33.1       Goals, Interventions and Follow-up Plan Goals: Improve sleep hygiene, increase physical activity, increase social engagement, decrease isolation, decrease depressive symptoms Interventions: Behavioral Activation CBT Cognitive Behavioral Therapy Medication Management Recommendations: Continue Cymbalta 60 mg Follow-up Plan: Continue bi-weekly cbt therapy  History of the present illness Presenting Problem/Current Symptoms:  The patient is a 52 year old female returning for a collaborative care follow-up one month after her last session. She reports continued stress from her new role at work, stating that although her recent promotion was positive, it has come with increased demands that have been stressful. She describes feeling low energy and a loss of interest in activities, mentioning that she primarily works and goes home, with limited social life or dating.   Regarding her medications, the patient reports that Cymbalta was working well for a while, but since starting Ozempic, she has experienced irritability and decreased appetite. She feels that the effectiveness of Cymbalta has diminished since beginning Ozempic and is interested in discussing possible adjustments to her medication regimen. She also continues to struggle with sleep, experiencing difficulty falling asleep and waking up early for work, leading to inadequate rest. The patient is considering a sleep aid, such as Trazodone, to help improve her sleep quality.   Her goals include becoming more  active, engaging more with friends, potentially finding a meaningful relationship, and improving her sleep. I will follow up with the psychiatric consultant and the patient's primary care provider to discuss potential adjustments to her medications and consider the use of a sleep aid to address her concerns. A follow-up appointment will be scheduled to evaluate the effectiveness of any new interventions and continue supporting the patient in managing her stress and enhancing her overall well-being.  Screenings PHQ-9 Assessments:     10/29/2022    8:21 AM 10/23/2022    3:15 PM 08/27/2022    3:02 PM  Depression screen PHQ 2/9  Decreased Interest 1 1 1   Down, Depressed, Hopeless 1 1 1   PHQ - 2 Score 2 2 2   Altered sleeping 3 2 2   Tired, decreased energy 2 2 0  Change in appetite 1 2 1   Feeling bad or failure about yourself  0 1 0  Trouble concentrating 1 1 0  Moving slowly or fidgety/restless 0 0 0  Suicidal thoughts 0 0 0  PHQ-9 Score 9 10 5   Difficult doing work/chores Somewhat difficult     GAD-7 Assessments:     10/29/2022    8:21 AM 10/23/2022    3:16 PM 08/27/2022    3:06 PM 08/13/2022    9:07 AM  GAD 7 : Generalized Anxiety Score  Nervous, Anxious, on Edge 1 1 1 1   Control/stop worrying 1 0 0 1  Worry too much - different things 1 0 0 1  Trouble relaxing 1 1 0 1  Restless 0 0 0 0  Easily annoyed or irritable 2 2 0 1  Afraid - awful might happen 0 0 0 0  Total GAD 7 Score 6 4 1 5   Anxiety Difficulty  Somewhat difficult Somewhat difficult  Not difficult at all    Past Medical History Past Medical History:  Diagnosis Date   Anxiety    Depression    Elevated cholesterol    Hormone replacement therapy (HRT)    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  Allergies:  Allergies as of 10/28/2022 - Review Complete 08/13/2022  Allergen Reaction Noted   Sulfa antibiotics Nausea And Vomiting 09/12/2012    Medication History Current medications:  Outpatient Encounter Medications as of 10/28/2022  Medication Sig   benzonatate (TESSALON) 100 MG capsule Take 1 capsule (100 mg total) by mouth 2 (two) times daily as needed for cough. (Patient not taking: Reported on 10/29/2022)   DULoxetine (CYMBALTA) 60 MG capsule Take 1 capsule (60 mg total) by mouth daily.   hydrOXYzine (ATARAX) 10 MG tablet TAKE 1 TABLET BY MOUTH THREE TIMES DAILY AS NEEDED FOR ANXIETY   pravastatin (PRAVACHOL) 80 MG tablet Take 1 tablet (80 mg total) by mouth daily.   Pseudoephedrine-Ibuprofen 30-200 MG TABS Take by mouth.   Semaglutide, 2 MG/DOSE, 8 MG/3ML SOPN Inject 2 mg as directed once a week.   venlafaxine XR (EFFEXOR-XR) 150 MG 24 hr capsule TAKE 1 CAPSULE(150 MG) BY MOUTH DAILY WITH BREAKFAST   [DISCONTINUED] cyclobenzaprine (FLEXERIL) 10 MG tablet Take 1 tablet (10 mg total) by mouth 3 (three) times daily as needed for muscle spasms. (Patient not taking: Reported on 10/29/2022)   [DISCONTINUED] Fezolinetant (VEOZAH) 45 MG TABS Take 1 tablet (45 mg total) by mouth daily. (Patient not taking: Reported on 10/29/2022)   [DISCONTINUED] metFORMIN (GLUCOPHAGE) 500 MG tablet Take 1 tablet (500 mg total) by mouth daily with breakfast. (Patient not taking: Reported on 10/29/2022)   No facility-administered encounter medications on file as of 10/28/2022.     Scribe for Treatment Team: Reuel Boom

## 2022-10-29 ENCOUNTER — Encounter: Payer: Self-pay | Admitting: Family Medicine

## 2022-10-29 ENCOUNTER — Ambulatory Visit: Payer: BC Managed Care – PPO | Admitting: Family Medicine

## 2022-10-29 VITALS — BP 127/86 | HR 106 | Ht 64.0 in | Wt 205.1 lb

## 2022-10-29 DIAGNOSIS — E119 Type 2 diabetes mellitus without complications: Secondary | ICD-10-CM | POA: Diagnosis not present

## 2022-10-29 DIAGNOSIS — G4730 Sleep apnea, unspecified: Secondary | ICD-10-CM

## 2022-10-29 DIAGNOSIS — E785 Hyperlipidemia, unspecified: Secondary | ICD-10-CM | POA: Diagnosis not present

## 2022-10-29 DIAGNOSIS — Z7985 Long-term (current) use of injectable non-insulin antidiabetic drugs: Secondary | ICD-10-CM | POA: Diagnosis not present

## 2022-10-29 DIAGNOSIS — R0989 Other specified symptoms and signs involving the circulatory and respiratory systems: Secondary | ICD-10-CM | POA: Insufficient documentation

## 2022-10-29 DIAGNOSIS — Z0100 Encounter for examination of eyes and vision without abnormal findings: Secondary | ICD-10-CM | POA: Diagnosis not present

## 2022-10-29 DIAGNOSIS — Z1211 Encounter for screening for malignant neoplasm of colon: Secondary | ICD-10-CM

## 2022-10-29 MED ORDER — PREDNISONE 20 MG PO TABS: 20.0000 mg | ORAL_TABLET | Freq: Two times a day (BID) | ORAL | 0 refills | Status: AC

## 2022-10-29 MED ORDER — AZITHROMYCIN 250 MG PO TABS: ORAL_TABLET | ORAL | 0 refills | Status: DC

## 2022-10-29 NOTE — Progress Notes (Signed)
Patient Office Visit   Subjective   Patient ID: Sabrina Waters, female    DOB: 12-04-1970  Age: 52 y.o. MRN: [id number]  CC:  Chief Complaint  Patient presents with   Diabetes    Follow up   Sinus Problem    Pt. Complains of sinus pressure and nasal congestion    HPI Sabrina C Apple2 year old female, presents to the clinic for chronic follow up. 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  Patient complains of low grade fever. Patient describes symptoms of productive cough, fatigue and headache. Symptoms began 3 days ago and are gradually worsening since that time. Patient denies cough, dyspnea, or chest pain. Treatment thus far includes OTC analgesics/antipyretics: somewhat effective Past pulmonary history is significant for no history of pneumonia or bronchitis       Outpatient Encounter Medications as of 10/29/2022  Medication Sig   azithromycin (ZITHROMAX) 250 MG tablet Take 2 tablets on day 1, then 1 tablet daily on days 2 through 5   DULoxetine (CYMBALTA) 60 MG capsule Take 1 capsule (60 mg total) by mouth daily.   hydrOXYzine (ATARAX) 10 MG tablet TAKE 1 TABLET BY MOUTH THREE TIMES DAILY AS NEEDED FOR ANXIETY   pravastatin (PRAVACHOL) 80 MG tablet Take 1 tablet (80 mg total) by mouth daily.   predniSONE (DELTASONE) 20 MG tablet Take 1 tablet (20 mg total) by mouth 2 (two) times daily with a meal for 5 days.   Pseudoephedrine-Ibuprofen 30-200 MG TABS Take by mouth.   Semaglutide, 2 MG/DOSE, 8 MG/3ML SOPN Inject 2 mg as directed once a week.   venlafaxine XR (EFFEXOR-XR) 150 MG 24 hr capsule TAKE 1 CAPSULE(150 MG) BY MOUTH DAILY WITH BREAKFAST   benzonatate (TESSALON) 100 MG capsule Take 1 capsule (100 mg total) by mouth 2 (two) times daily as needed for cough. (Patient not taking: Reported on  10/29/2022)   [DISCONTINUED] cyclobenzaprine (FLEXERIL) 10 MG tablet Take 1 tablet (10 mg total) by mouth 3 (three) times daily as needed for muscle spasms. (Patient not taking: Reported on 10/29/2022)   [DISCONTINUED] Fezolinetant (VEOZAH) 45 MG TABS Take 1 tablet (45 mg total) by mouth daily. (Patient not taking: Reported on 10/29/2022)   [DISCONTINUED] metFORMIN (GLUCOPHAGE) 500 MG tablet Take 1 tablet (500 mg total) by mouth daily with breakfast. (Patient not taking: Reported on 10/29/2022)   No facility-administered encounter medications on file as of 10/29/2022.    Past Surgical History:  Procedure Laterality Date   ENDOMETRIAL ABLATION      Review of Systems  Constitutional:  Positive for fever and malaise/fatigue.  HENT:  Positive for congestion, sinus pain and sore throat.   Eyes:  Negative for blurred vision.  Respiratory:  Positive for cough and sputum production. Negative for shortness of breath.   Cardiovascular:  Negative for palpitations.  Gastrointestinal:  Negative for abdominal pain.  Genitourinary:  Positive for dysuria.  Neurological:  Positive for headaches. Negative for dizziness.      Objective    BP 127/86 (BP Location: Right Arm, Patient Position: Sitting, Cuff Size: Large)   Pulse (!) 106   Ht 5\' 4"  (1.626 m)   Wt 205 lb 1.3 oz (93 kg)   SpO2 92%   BMI 35.20 kg/m   Physical Exam Vitals reviewed.  Constitutional:      General: She is not  in acute distress.    Appearance: Normal appearance. She is not ill-appearing, toxic-appearing or diaphoretic.  HENT:     Head: Normocephalic.     Right Ear: Tympanic membrane normal.     Left Ear: Tympanic membrane normal.     Nose: Congestion and rhinorrhea present.     Mouth/Throat:     Pharynx: Posterior oropharyngeal erythema present.  Eyes:     General:        Right eye: No discharge.        Left eye: No discharge.     Conjunctiva/sclera: Conjunctivae normal.  Cardiovascular:     Rate and Rhythm: Normal  rate.     Pulses: Normal pulses.     Heart sounds: Normal heart sounds.  Pulmonary:     Effort: Pulmonary effort is normal. No respiratory distress.     Breath sounds: Normal breath sounds.  Abdominal:     Tenderness: There is no abdominal tenderness.  Musculoskeletal:        General: Normal range of motion.     Cervical back: Normal range of motion.  Skin:    General: Skin is warm and dry.     Capillary Refill: Capillary refill takes less than 2 seconds.  Neurological:     General: No focal deficit present.     Mental Status: She is alert.     Coordination: Coordination normal.     Gait: Gait normal.  Psychiatric:        Mood and Affect: Mood normal.       Assessment & Plan:  Type 2 diabetes mellitus without complication, without long-term current use of insulin (HCC) Assessment & Plan: Last hemoglobin A1c Reading 6.4  Labs ordered today awaiting results will follow up. Patient reports taking  Ozempic 2 mg weekly injections.  Discussed medication desired effects, potential side effects, and how to administer the medication. Nonpharmacological interventions such as low carb diet,high in protein, vegetables and fruit discussed. Educated on importance of physical activity 150 minutes per week. Discussed signs and symptoms of hypoglycemia, & hyperglycemia and need to present to the ED if symptoms occurs.Follow up in 3 months or sooner if needed. Patient verbalizes understanding regarding plan of care and all questions answered. Ophthalmology referral placed , Foot exam within desired limits   Orders: -     Hemoglobin A1c -     Microalbumin / creatinine urine ratio  Hyperlipidemia, unspecified hyperlipidemia type -     Lipid panel -     BMP8+eGFR -     CBC with Differential/Platelet  Screening for colon cancer -     Cologuard  Diabetic eye exam (HCC)  Sleep apnea, unspecified type -     Ambulatory referral to Sleep Studies  Sinus symptom Assessment & Plan: Azithromycin  250 x 5 days Prednisone 20 mg twice daily x 5 days Advise symptomatic treatment, rest, increase oral fluid intake. Take OTC tylenol pain or fever medications. Follow-up for worsening or persistent symptoms. Patient verbalizes understanding regarding plan of care and all questions answered   Orders: -     COVID-19, Flu A+B and RSV  Other orders -     Azithromycin; Take 2 tablets on day 1, then 1 tablet daily on days 2 through 5  Dispense: 6 tablet; Refill: 0 -     predniSONE; Take 1 tablet (20 mg total) by mouth 2 (two) times daily with a meal for 5 days.  Dispense: 10 tablet; Refill: 0    Return in about  4 months (around 02/28/2023), or if symptoms worsen or fail to improve, for hyperlipidemia, type 2 diabetes.   Cruzita Lederer Newman Nip, FNP

## 2022-10-29 NOTE — Assessment & Plan Note (Signed)
Last hemoglobin A1c Reading 6.4  Labs ordered today awaiting results will follow up. Patient reports taking  Ozempic 2 mg weekly injections.  Discussed medication desired effects, potential side effects, and how to administer the medication. Nonpharmacological interventions such as low carb diet,high in protein, vegetables and fruit discussed. Educated on importance of physical activity 150 minutes per week. Discussed signs and symptoms of hypoglycemia, & hyperglycemia and need to present to the ED if symptoms occurs.Follow up in 3 months or sooner if needed. Patient verbalizes understanding regarding plan of care and all questions answered. Ophthalmology referral placed , Foot exam within desired limits

## 2022-10-29 NOTE — Assessment & Plan Note (Signed)
Azithromycin 250 x 5 days Prednisone 20 mg twice daily x 5 days Advise symptomatic treatment, rest, increase oral fluid intake. Take OTC tylenol pain or fever medications. Follow-up for worsening or persistent symptoms. Patient verbalizes understanding regarding plan of care and all questions answered

## 2022-10-29 NOTE — Patient Instructions (Signed)

## 2022-10-31 LAB — COVID-19, FLU A+B AND RSV
Influenza A, NAA: NOT DETECTED
Influenza B, NAA: NOT DETECTED
RSV, NAA: NOT DETECTED
SARS-CoV-2, NAA: NOT DETECTED

## 2022-11-11 ENCOUNTER — Other Ambulatory Visit: Payer: Self-pay | Admitting: Adult Health

## 2022-11-12 ENCOUNTER — Ambulatory Visit (INDEPENDENT_AMBULATORY_CARE_PROVIDER_SITE_OTHER): Payer: BC Managed Care – PPO

## 2022-11-12 DIAGNOSIS — E119 Type 2 diabetes mellitus without complications: Secondary | ICD-10-CM | POA: Diagnosis not present

## 2022-11-12 DIAGNOSIS — R7309 Other abnormal glucose: Secondary | ICD-10-CM

## 2022-11-12 NOTE — Progress Notes (Signed)
Sabrina Waters arrived 11/12/2022 and has given verbal consent to obtain images and complete their overdue diabetic retinal screening.  The images have been sent to an ophthalmologist or optometrist for review and interpretation.  Results will be sent back to Del Newman Nip, [place], FNP for review.  Patient has been informed they will be contacted when we receive the results via telephone or MyChart

## 2022-11-16 ENCOUNTER — Encounter: Payer: Self-pay | Admitting: Family Medicine

## 2022-11-19 ENCOUNTER — Ambulatory Visit: Payer: BC Managed Care – PPO | Admitting: Professional Counselor

## 2022-11-23 ENCOUNTER — Encounter: Payer: Self-pay | Admitting: Family Medicine

## 2022-11-23 ENCOUNTER — Ambulatory Visit: Payer: BC Managed Care – PPO | Admitting: Family Medicine

## 2022-11-23 VITALS — BP 136/90 | HR 87 | Resp 16 | Ht 64.0 in | Wt 204.0 lb

## 2022-11-23 DIAGNOSIS — R12 Heartburn: Secondary | ICD-10-CM

## 2022-11-23 DIAGNOSIS — E119 Type 2 diabetes mellitus without complications: Secondary | ICD-10-CM

## 2022-11-23 DIAGNOSIS — Z7985 Long-term (current) use of injectable non-insulin antidiabetic drugs: Secondary | ICD-10-CM

## 2022-11-23 DIAGNOSIS — R0683 Snoring: Secondary | ICD-10-CM | POA: Diagnosis not present

## 2022-11-23 DIAGNOSIS — G4733 Obstructive sleep apnea (adult) (pediatric): Secondary | ICD-10-CM | POA: Diagnosis not present

## 2022-11-23 DIAGNOSIS — R030 Elevated blood-pressure reading, without diagnosis of hypertension: Secondary | ICD-10-CM

## 2022-11-23 DIAGNOSIS — T887XXA Unspecified adverse effect of drug or medicament, initial encounter: Secondary | ICD-10-CM

## 2022-11-23 MED ORDER — TIRZEPATIDE 2.5 MG/0.5ML ~~LOC~~ SOAJ
2.5000 mg | SUBCUTANEOUS | 0 refills | Status: DC
Start: 2022-11-23 — End: 2022-12-18

## 2022-11-23 NOTE — Assessment & Plan Note (Signed)
Vitals:   11/23/22 1409 11/23/22 1425  BP: (!) 142/93 (!) 136/90  Follow up in 4 weeks with at home blood pressure readings to monitor trends. Possible medication intervention if still elevated Continued discussion on DASH diet, low sodium diet and maintain a exercise routine for 150 minutes per week.

## 2022-11-23 NOTE — Patient Instructions (Addendum)
        Great to see you today.  I have refilled the medication(s) we provide.   Please Follow up 3-4 weeks with at home blood pressure readings via "MYCHART" to monitor trends.   If labs were collected, we will inform you of lab results once received either by echart message or telephone call.   - echart message- for normal results that have been seen by the patient already.   - telephone call: abnormal results or if patient has not viewed results in their echart.   - Please take medications as prescribed. - Follow up with your primary health provider if any health concerns arises. - If symptoms worsen please contact your primary care provider and/or visit the emergency department.

## 2022-11-23 NOTE — Assessment & Plan Note (Signed)
Explained to patient lifestyle modifications, weight loss, smoking cessation, loose clothing, and avoiding caffeine and trigger foods. Follow up in 4 weeks, if symptoms persist possible referral to GI or Protonix medication.

## 2022-11-23 NOTE — Assessment & Plan Note (Signed)
Stop Bang score 4 points Intermediate risk Referral placed to sleep studies

## 2022-11-23 NOTE — Assessment & Plan Note (Signed)
Patient reports nausea, gas, constipation and abdominal discomfort on Ozempic Patient stop taking medication about one week ago  Started Mounjaro 2.5 mg once a week injection Follow up for worsening symptoms

## 2022-11-23 NOTE — Progress Notes (Signed)
Patient Office Visit   Subjective   Patient ID: Sabrina Waters, female    DOB: [birth year]  Age: 52 y.o. MRN: [id number]  CC:  Chief Complaint  Patient presents with   Nausea    Has been experiencing nausea and gas since starting Ozempic. She skipped dose last week and it improved.   Gas    HPI Sabrina Waters 52 year old female,presents to the clinic for worsening symptoms of nausea and gas on ozempic injections. 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  Heartburn She complains of abdominal pain, heartburn and nausea. This is a chronic problem. The problem occurs occasionally. The problem has been waxing and waning. The heartburn duration is several minutes. The heartburn is located in the substernum. The heartburn does not wake her from sleep. The heartburn does not limit her activity. The heartburn doesn't change with position. The symptoms are aggravated by certain foods. Pertinent negatives include no melena. There are no known risk factors. She has tried a diet change for the symptoms. The treatment provided significant relief.      Outpatient Encounter Medications as of 11/23/2022  Medication Sig   DULoxetine (CYMBALTA) 60 MG capsule Take 1 capsule (60 mg total) by mouth daily.   hydrOXYzine (ATARAX) 10 MG tablet TAKE 1 TABLET BY MOUTH THREE TIMES DAILY AS NEEDED FOR ANXIETY   pravastatin (PRAVACHOL) 80 MG tablet TAKE 1 TABLET(80 MG) BY MOUTH DAILY   tirzepatide (MOUNJARO) 2.5 MG/0.5ML Pen Inject 2.5 mg into the skin once a week.   venlafaxine XR (EFFEXOR-XR) 150 MG 24 hr capsule TAKE 1 CAPSULE(150 MG) BY MOUTH DAILY WITH BREAKFAST   [DISCONTINUED] Semaglutide, 2 MG/DOSE, 8 MG/3ML SOPN Inject 2 mg as directed once a week.   [DISCONTINUED] azithromycin (ZITHROMAX) 250 MG tablet Take 2 tablets  on day 1, then 1 tablet daily on days 2 through 5   [DISCONTINUED] benzonatate (TESSALON) 100 MG capsule Take 1 capsule (100 mg total) by mouth 2 (two) times daily as needed for cough. (Patient not taking: Reported on 10/29/2022)   [DISCONTINUED] Pseudoephedrine-Ibuprofen 30-200 MG TABS Take by mouth.   No facility-administered encounter medications on file as of 11/23/2022.    Past Surgical History:  Procedure Laterality Date   ENDOMETRIAL ABLATION      Review of Systems  Constitutional:  Negative for chills and fever.  Eyes:  Negative for blurred vision.  Gastrointestinal:  Positive for abdominal pain, constipation, heartburn and nausea. Negative for blood in stool, diarrhea, melena and vomiting.  Genitourinary:  Negative for dysuria.  Neurological:  Negative for dizziness and headaches.      Objective    BP (!) 136/90   Pulse 87   Resp 16   Ht 5\' 4"  (1.626 m)   Wt 204 lb (92.5 kg)   SpO2 98%   BMI 35.02 kg/m   Physical Exam Vitals reviewed.  Constitutional:      General: She is not in acute distress.    Appearance: Normal appearance. She is not ill-appearing, toxic-appearing or diaphoretic.  HENT:     Head: Normocephalic.  Eyes:     General:        Right eye: No discharge.        Left eye: No discharge.     Conjunctiva/sclera: Conjunctivae normal.  Cardiovascular:     Rate and Rhythm: Normal  rate.     Pulses: Normal pulses.     Heart sounds: Normal heart sounds.  Pulmonary:     Effort: Pulmonary effort is normal. No respiratory distress.     Breath sounds: Normal breath sounds.  Abdominal:     General: Bowel sounds are normal.     Palpations: Abdomen is soft.     Tenderness: There is no abdominal tenderness. There is no right CVA tenderness, left CVA tenderness or guarding.  Musculoskeletal:        General: Normal range of motion.     Cervical back: Normal range of motion.  Skin:    General: Skin is warm and dry.     Capillary Refill: Capillary refill takes  less than 2 seconds.  Neurological:     General: No focal deficit present.     Mental Status: She is alert.     Coordination: Coordination normal.     Gait: Gait normal.  Psychiatric:        Mood and Affect: Mood normal.        Behavior: Behavior normal.       Assessment & Plan:  Type 2 diabetes mellitus without complication, without long-term current use of insulin (HCC) -     Tirzepatide; Inject 2.5 mg into the skin once a week.  Dispense: 2 mL; Refill: 0  Snoring -     Ambulatory referral to Sleep Studies  Obstructive sleep apnea syndrome Assessment & Plan: Stop Bang score 4 points Intermediate risk Referral placed to sleep studies    Heart burn Assessment & Plan: Explained to patient lifestyle modifications, weight loss, smoking cessation, loose clothing, and avoiding caffeine and trigger foods. Follow up in 4 weeks, if symptoms persist possible referral to GI or Protonix medication.   Medication side effect Assessment & Plan: Patient reports nausea, gas, constipation and abdominal discomfort on Ozempic Patient stop taking medication about one week ago  Started Mounjaro 2.5 mg once a week injection Follow up for worsening symptoms    Elevated BP without diagnosis of hypertension Assessment & Plan: Vitals:   11/23/22 1409 11/23/22 1425  BP: (!) 142/93 (!) 136/90  Follow up in 4 weeks with at home blood pressure readings to monitor trends. Possible medication intervention if still elevated Continued discussion on DASH diet, low sodium diet and maintain a exercise routine for 150 minutes per week.      Return if symptoms worsen or fail to improve.   Cruzita Lederer Newman Nip, FNP

## 2022-12-11 ENCOUNTER — Ambulatory Visit: Payer: BC Managed Care – PPO | Admitting: Professional Counselor

## 2022-12-12 ENCOUNTER — Other Ambulatory Visit: Payer: Self-pay | Admitting: Family Medicine

## 2022-12-16 DIAGNOSIS — E119 Type 2 diabetes mellitus without complications: Secondary | ICD-10-CM | POA: Diagnosis not present

## 2022-12-16 DIAGNOSIS — E785 Hyperlipidemia, unspecified: Secondary | ICD-10-CM | POA: Diagnosis not present

## 2022-12-18 ENCOUNTER — Other Ambulatory Visit: Payer: Self-pay | Admitting: Family Medicine

## 2022-12-18 ENCOUNTER — Encounter: Payer: Self-pay | Admitting: Family Medicine

## 2022-12-18 LAB — CBC WITH DIFFERENTIAL/PLATELET
Basophils Absolute: 0.1 10*3/uL (ref 0.0–0.2)
Basos: 1 %
EOS (ABSOLUTE): 0.1 10*3/uL (ref 0.0–0.4)
Eos: 1 %
Hematocrit: 49.2 % — ABNORMAL HIGH (ref 34.0–46.6)
Hemoglobin: 15.6 g/dL (ref 11.1–15.9)
Immature Grans (Abs): 0 10*3/uL (ref 0.0–0.1)
Immature Granulocytes: 0 %
Lymphocytes Absolute: 2.6 10*3/uL (ref 0.7–3.1)
Lymphs: 32 %
MCH: 30.2 pg (ref 26.6–33.0)
MCHC: 31.7 g/dL (ref 31.5–35.7)
MCV: 95 fL (ref 79–97)
Monocytes Absolute: 0.6 10*3/uL (ref 0.1–0.9)
Monocytes: 7 %
Neutrophils Absolute: 4.7 10*3/uL (ref 1.4–7.0)
Neutrophils: 59 %
Platelets: 236 10*3/uL (ref 150–450)
RBC: 5.17 x10E6/uL (ref 3.77–5.28)
RDW: 11.8 % (ref 11.7–15.4)
WBC: 8 10*3/uL (ref 3.4–10.8)

## 2022-12-18 LAB — LIPID PANEL
Chol/HDL Ratio: 5.2 {ratio} — ABNORMAL HIGH (ref 0.0–4.4)
Cholesterol, Total: 182 mg/dL (ref 100–199)
HDL: 35 mg/dL — ABNORMAL LOW (ref >39)
LDL Chol Calc (NIH): 112 mg/dL — ABNORMAL HIGH (ref 0–99)
Triglycerides: 199 mg/dL — ABNORMAL HIGH (ref 0–149)
VLDL Cholesterol Cal: 35 mg/dL (ref 5–40)

## 2022-12-18 LAB — BMP8+EGFR
BUN/Creatinine Ratio: 8 — ABNORMAL LOW (ref 9–23)
BUN: 7 mg/dL (ref 6–24)
CO2: 23 mmol/L (ref 20–29)
Calcium: 9.4 mg/dL (ref 8.7–10.2)
Chloride: 106 mmol/L (ref 96–106)
Creatinine, Ser: 0.89 mg/dL (ref 0.57–1.00)
Glucose: 98 mg/dL (ref 70–99)
Potassium: 4.7 mmol/L (ref 3.5–5.2)
Sodium: 144 mmol/L (ref 134–144)
eGFR: 78 mL/min/{1.73_m2} (ref >59)

## 2022-12-18 LAB — MICROALBUMIN / CREATININE URINE RATIO
Creatinine, Urine: 247.8 mg/dL
Microalb/Creat Ratio: 15 mg/g{creat} (ref 0–29)
Microalbumin, Urine: 36.7 ug/mL

## 2022-12-18 LAB — HEMOGLOBIN A1C
Est. average glucose Bld gHb Est-mCnc: 117 mg/dL
Hgb A1c MFr Bld: 5.7 % — ABNORMAL HIGH (ref 4.8–5.6)

## 2022-12-18 MED ORDER — TIRZEPATIDE 5 MG/0.5ML ~~LOC~~ SOAJ: 5.0000 mg | SUBCUTANEOUS | 0 refills | Status: DC

## 2022-12-18 NOTE — Telephone Encounter (Signed)
Increased dose I sent in 5 mg :)  Thanks

## 2022-12-18 NOTE — Telephone Encounter (Signed)
She is asking for a refill of mounjaro. Can I refill the current dose or does she need to move up in mg? Thanks

## 2023-01-15 ENCOUNTER — Encounter: Payer: Self-pay | Admitting: Family Medicine

## 2023-01-18 ENCOUNTER — Institutional Professional Consult (permissible substitution): Payer: BC Managed Care – PPO | Admitting: Nurse Practitioner

## 2023-01-18 ENCOUNTER — Other Ambulatory Visit: Payer: Self-pay | Admitting: Family Medicine

## 2023-01-18 MED ORDER — TIRZEPATIDE 7.5 MG/0.5ML ~~LOC~~ SOAJ: 7.5000 mg | SUBCUTANEOUS | 0 refills | Status: DC

## 2023-01-21 ENCOUNTER — Telehealth: Payer: BC Managed Care – PPO | Admitting: Physician Assistant

## 2023-01-21 DIAGNOSIS — J069 Acute upper respiratory infection, unspecified: Secondary | ICD-10-CM

## 2023-01-21 MED ORDER — BENZONATATE 200 MG PO CAPS
200.0000 mg | ORAL_CAPSULE | Freq: Three times a day (TID) | ORAL | 0 refills | Status: DC | PRN
Start: 2023-01-21 — End: 2023-03-01

## 2023-01-21 MED ORDER — PSEUDOEPHEDRINE HCL ER 120 MG PO TB12
120.0000 mg | ORAL_TABLET | Freq: Two times a day (BID) | ORAL | 0 refills | Status: DC
Start: 2023-01-21 — End: 2023-06-30

## 2023-01-21 MED ORDER — FLUTICASONE PROPIONATE 50 MCG/ACT NA SUSP
2.0000 | Freq: Every day | NASAL | 1 refills | Status: AC
Start: 2023-01-21 — End: ?

## 2023-01-21 NOTE — Progress Notes (Signed)
E-Visit for Upper Respiratory Infection   We are sorry you are not feeling well.  Here is how we plan to help!  Based on what you have shared with me, it looks like you may have a viral upper respiratory infection.  Upper respiratory infections are caused by a large number of viruses; however, rhinovirus is the most common cause.   Symptoms vary from person to person, with common symptoms including sore throat, cough, fatigue or lack of energy and feeling of general discomfort.  A low-grade fever of up to 100.4 may present, but is often uncommon.  Symptoms vary however, and are closely related to a person's age or underlying illnesses.  The most common symptoms associated with an upper respiratory infection are nasal discharge or congestion, cough, sneezing, headache and pressure in the ears and face.  These symptoms usually persist for about 3 to 10 days, but can last up to 2 weeks.  It is important to know that upper respiratory infections do not cause serious illness or complications in most cases.    Upper respiratory infections can be transmitted from person to person, with the most common method of transmission being a person's hands.  The virus is able to live on the skin and can infect other persons for up to 2 hours after direct contact.  Also, these can be transmitted when someone coughs or sneezes; thus, it is important to cover the mouth to reduce this risk.  To keep the spread of the illness at bay, good hand hygiene is very important.  This is an infection that is most likely caused by a virus. There are no specific treatments other than to help you with the symptoms until the infection runs its course.  We are sorry you are not feeling well.  Here is how we plan to help!   For nasal congestion, you may use an oral decongestants such as Mucinex D or if you have glaucoma or high blood pressure use plain Mucinex.  Saline nasal spray or nasal drops can help and can safely be used as often as  needed for congestion.  For your congestion, I have prescribed Fluticasone nasal spray one spray in each nostril twice a day  If you do not have a history of heart disease, hypertension, diabetes or thyroid disease, prostate/bladder issues or glaucoma, you may also use Sudafed to treat nasal congestion.  It is highly recommended that you consult with a pharmacist or your primary care physician to ensure this medication is safe for you to take.     If you have a cough, you may use cough suppressants such as Delsym and Robitussin.  If you have glaucoma or high blood pressure, you can also use Coricidin HBP.   For cough I have prescribed for you A prescription cough medication called Tessalon Perles 200 mg. You may take 1 capsules every 8 hours as needed for cough. You may also take Sudafed for congestion. I've sent a Rx for this to the pharmacy as well.   If you have a sore or scratchy throat, use a saltwater gargle-  to  teaspoon of salt dissolved in a 4-ounce to 8-ounce glass of warm water.  Gargle the solution for approximately 15-30 seconds and then spit.  It is important not to swallow the solution.  You can also use throat lozenges/cough drops and Chloraseptic spray to help with throat pain or discomfort.  Warm or cold liquids can also be helpful in relieving throat pain.  For headache, pain or general discomfort, you can use Ibuprofen or Tylenol as directed.   Some authorities believe that zinc sprays or the use of Echinacea may shorten the course of your symptoms.   HOME CARE Only take medications as instructed by your medical team. Be sure to drink plenty of fluids. Water is fine as well as fruit juices, sodas and electrolyte beverages. You may want to stay away from caffeine or alcohol. If you are nauseated, try taking small sips of liquids. How do you know if you are getting enough fluid? Your urine should be a pale yellow or almost colorless. Get rest. Taking a steamy shower or using a  humidifier may help nasal congestion and ease sore throat pain. You can place a towel over your head and breathe in the steam from hot water coming from a faucet. Using a saline nasal spray works much the same way. Cough drops, hard candies and sore throat lozenges may ease your cough. Avoid close contacts especially the very young and the elderly Cover your mouth if you cough or sneeze Always remember to wash your hands.   GET HELP RIGHT AWAY IF: You develop worsening fever. If your symptoms do not improve within 10 days You develop yellow or green discharge from your nose over 3 days. You have coughing fits You develop a severe head ache or visual changes. You develop shortness of breath, difficulty breathing or start having chest pain Your symptoms persist after you have completed your treatment plan  MAKE SURE YOU  Understand these instructions. Will watch your condition. Will get help right away if you are not doing well or get worse.  Thank you for choosing an e-visit.  Your e-visit answers were reviewed by a board certified advanced clinical practitioner to complete your personal care plan. Depending upon the condition, your plan could have included both over the counter or prescription medications.  Please review your pharmacy choice. Make sure the pharmacy is open so you can pick up prescription now. If there is a problem, you may contact your provider through [employer] and have the prescription routed to another pharmacy.  Your safety is important to [place]. If you have drug allergies check your prescription carefully.   For the next 24 hours you can use MyChart to ask questions about today's visit, request a non-urgent call back, or ask for a work or school excuse. You will get an email in the next two days asking about your experience. I hope that your e-visit has been valuable and will speed your recovery. I have spent 5 minutes in review of e-visit questionnaire, review  and updating patient chart, medical decision making and response to patient.   Gilberto Better, PA-C

## 2023-02-09 ENCOUNTER — Other Ambulatory Visit: Payer: Self-pay | Admitting: Adult Health

## 2023-02-14 ENCOUNTER — Encounter: Payer: Self-pay | Admitting: Family Medicine

## 2023-02-15 ENCOUNTER — Other Ambulatory Visit: Payer: Self-pay

## 2023-02-15 ENCOUNTER — Other Ambulatory Visit: Payer: Self-pay | Admitting: Family Medicine

## 2023-02-15 MED ORDER — HYDROXYZINE HCL 10 MG PO TABS: 10.0000 mg | ORAL_TABLET | Freq: Three times a day (TID) | ORAL | 1 refills | Status: DC | PRN

## 2023-02-15 MED ORDER — TIRZEPATIDE 7.5 MG/0.5ML ~~LOC~~ SOAJ: 7.5000 mg | SUBCUTANEOUS | 0 refills | Status: DC

## 2023-02-15 MED ORDER — VENLAFAXINE HCL ER 150 MG PO CP24: ORAL_CAPSULE | ORAL | 6 refills | Status: DC

## 2023-02-15 NOTE — Telephone Encounter (Signed)
sent 

## 2023-02-26 NOTE — Progress Notes (Unsigned)
   Established Patient Office Visit   Subjective  Patient ID: Sabrina Waters, female    DOB: Jun 02, 1970  Age: 52 y.o. MRN: [id number]  No chief complaint on file.   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  HPI  ROS    Objective:     There were no vitals taken for this visit. {Vitals History (Optional):23777}  Physical Exam   No results found for any visits on 03/01/23.  The 10-year ASCVD risk score (Arnett DK, et al., 2019) is: 12.3%    Assessment & Plan:  There are no diagnoses linked to this encounter.  No follow-ups on file.   Cruzita Lederer Newman Nip, FNP

## 2023-02-26 NOTE — Patient Instructions (Signed)

## 2023-03-01 ENCOUNTER — Ambulatory Visit: Payer: BC Managed Care – PPO | Admitting: Family Medicine

## 2023-03-01 VITALS — BP 115/82 | HR 116 | Ht 64.0 in | Wt 196.1 lb

## 2023-03-01 DIAGNOSIS — J0111 Acute recurrent frontal sinusitis: Secondary | ICD-10-CM

## 2023-03-01 MED ORDER — LEVOCETIRIZINE DIHYDROCHLORIDE 5 MG PO TABS
5.0000 mg | ORAL_TABLET | Freq: Every evening | ORAL | 1 refills | Status: DC
Start: 2023-03-01 — End: 2023-06-30

## 2023-03-01 MED ORDER — AMOXICILLIN-POT CLAVULANATE 875-125 MG PO TABS: 1.0000 | ORAL_TABLET | Freq: Two times a day (BID) | ORAL | 0 refills | Status: AC

## 2023-03-01 NOTE — Addendum Note (Signed)
Addended by: Rica Records on: 03/01/2023 12:18 PM   Modules accepted: Level of Service

## 2023-03-01 NOTE — Assessment & Plan Note (Signed)
Augmentin 875-125 mg twice daily x 7 days, Xyzal 5 mg at bedtime. Advise patient to rest to support your body's recovery. Stay hydrated by drinking water, tea, or broth. Using a humidifier can help soothe throat irritation and ease nasal congestion. For fever or pain, acetaminophen (Tylenol) is recommended. To relieve other symptoms, try saline nasal sprays, throat lozenges, or gargling with saltwater. Focus on eating light, healthy meals like fruits and vegetables to keep your strength up. Practice good hygiene by washing your hands frequently and covering your mouth when coughing or sneezing.Follow-up for worsening or persistent symptoms. Patient verbalizes understanding regarding plan of care and all questions answered

## 2023-03-10 ENCOUNTER — Encounter: Payer: Self-pay | Admitting: Family Medicine

## 2023-03-16 ENCOUNTER — Other Ambulatory Visit: Payer: Self-pay | Admitting: Family Medicine

## 2023-03-16 ENCOUNTER — Other Ambulatory Visit: Payer: Self-pay | Admitting: Adult Health

## 2023-03-16 MED ORDER — TIRZEPATIDE 10 MG/0.5ML ~~LOC~~ SOAJ: 10.0000 mg | SUBCUTANEOUS | 0 refills | Status: DC

## 2023-03-16 NOTE — Telephone Encounter (Signed)
 sent

## 2023-03-17 ENCOUNTER — Institutional Professional Consult (permissible substitution): Payer: BC Managed Care – PPO | Admitting: Nurse Practitioner

## 2023-03-19 ENCOUNTER — Ambulatory Visit: Payer: BC Managed Care – PPO | Admitting: Professional Counselor

## 2023-03-19 DIAGNOSIS — F331 Major depressive disorder, recurrent, moderate: Secondary | ICD-10-CM | POA: Diagnosis not present

## 2023-03-19 NOTE — BH Specialist Note (Unsigned)
[facility] Follow-up  MRN: [id number] NAME: Sabrina Waters Date: 03/19/23  Start time: Start Time: 0100 End time: Stop Time: 0140 Total time: Total Time in Minutes (Visit): 40 Call number: Visit Number: 6-Sixth Visit  Reason for call today:  The patient is a 53 year old female presenting for a follow-up visit regarding clavicle care. It has been a couple of months since her last appointment. She reported initially feeling well, noting that meeting someone had lifted her mood as she found companionship and compatibility. However, the relationship ended abruptly when he ghosted her around Christmas, leading to feelings of rejection and the recurrence of depressive symptoms. This discouraged her about dating and finding a suitable partner. Recently, she met another individual, giving the relationship a chance, but he also seems to be fading. During the session, the behavioral counselor and patient discussed her emotional response and thought patterns related to these brief relationships, identifying a tendency to give people the benefit of the doubt while struggling with hope for a meaningful connection. Her chief complaint is loneliness, although she still maintains some optimism. She reported work is going well but continues to experience difficulty sleeping. The patient expressed interest in a potential medication adjustment, and the counselor suggested a referral to a psychiatrist to evaluate her current regimen. In the meantime, she was encouraged to connect with friends and engage in enjoyable activities outside of romantic relationships. A follow-up is scheduled in two weeks.  PHQ-9 Scores:     03/19/2023    1:12 PM 11/23/2022    2:14 PM 10/29/2022    8:21 AM 10/23/2022    3:15 PM 08/27/2022    3:02 PM  Depression screen PHQ 2/9  Decreased Interest 2 1 1 1 1   Down, Depressed, Hopeless 1 0 1 1 1   PHQ - 2 Score 3 1 2 2 2   Altered sleeping 2 1 3 2 2   Tired, decreased energy 2 1 2 2  0  Change  in appetite 1 1 1 2 1   Feeling bad or failure about yourself  0 0 0 1 0  Trouble concentrating 1 0 1 1 0  Moving slowly or fidgety/restless 0 0 0 0 0  Suicidal thoughts 0 0 0 0 0  PHQ-9 Score 9 4 9 10 5   Difficult doing work/chores Somewhat difficult  Somewhat difficult     GAD-7 Scores:     03/19/2023    1:13 PM 11/23/2022    2:15 PM 10/29/2022    8:21 AM 10/23/2022    3:16 PM  GAD 7 : Generalized Anxiety Score  Nervous, Anxious, on Edge 1 0 1 1  Control/stop worrying 1 0 1 0  Worry too much - different things 1 1 1  0  Trouble relaxing 1 0 1 1  Restless 0 0 0 0  Easily annoyed or irritable 2 1 2 2   Afraid - awful might happen 0 0 0 0  Total GAD 7 Score 6 2 6 4   Anxiety Difficulty Somewhat difficult  Somewhat difficult Somewhat difficult    Stress Current stressors:  Dating Sleep:  still sleeping 5 hrs a night Appetite:  Eating once a day  Coping ability:  Fair Patient taking medications as prescribed:  Yes  Current medications:  Outpatient Encounter Medications as of 03/19/2023  Medication Sig   DULoxetine (CYMBALTA) 60 MG capsule TAKE 1 CAPSULE(60 MG) BY MOUTH DAILY   fluticasone (FLONASE) 50 MCG/ACT nasal spray Place 2 sprays into both nostrils daily.   hydrOXYzine (ATARAX) 10  MG tablet Take 1 tablet (10 mg total) by mouth 3 (three) times daily as needed. for anxiety   levocetirizine (XYZAL) 5 MG tablet Take 1 tablet (5 mg total) by mouth every evening.   pravastatin (PRAVACHOL) 80 MG tablet TAKE 1 TABLET(80 MG) BY MOUTH DAILY   pseudoephedrine (SUDAFED) 120 MG 12 hr tablet Take 1 tablet (120 mg total) by mouth 2 (two) times daily.   tirzepatide (MOUNJARO) 10 MG/0.5ML Pen Inject 10 mg into the skin once a week.   venlafaxine XR (EFFEXOR-XR) 150 MG 24 hr capsule TAKE 1 CAPSULE(150 MG) BY MOUTH DAILY WITH BREAKFAST   No facility-administered encounter medications on file as of 03/19/2023.     Self-harm Behaviors Risk Assessment Self-harm risk factors:  Depression, lives  alone Patient endorses recent thoughts of harming self:  Denies   Danger to Others Risk Assessment Danger to others risk factors:  None Patient endorses recent thoughts of harming others:  Denies    Substance Use Assessment Patient recently consumed alcohol:  Not since christmas  Alcohol Use Disorder Identification Test (AUDIT):     11/15/2019    9:51 AM 07/03/2022    9:47 AM 08/06/2022   11:03 AM 08/13/2022    8:18 AM 03/01/2023   10:37 AM  Alcohol Use Disorder Test (AUDIT)  1. How often do you have a drink containing alcohol? 2 1 1 1 1   2. How many drinks containing alcohol do you have on a typical day when you are drinking? 0 0 2 0 0  3. How often do you have six or more drinks on one occasion? 0 0 0 0 1  AUDIT-C Score 2 1 3 1 2    4. How often during the last year have you found that you were not able to stop drinking once you had started?   0    5. How often during the last year have you failed to do what was normally expected from you because of drinking?   0    6. How often during the last year have you needed a first drink in the morning to get yourself going after a heavy drinking session?   0    7. How often during the last year have you had a feeling of guilt of remorse after drinking?   0    8. How often during the last year have you been unable to remember what happened the night before because you had been drinking?   0    9. Have you or someone else been injured as a result of your drinking?   0    10. Has a relative or friend or a doctor or another health worker been concerned about your drinking or suggested you cut down?   0    Alcohol Use Disorder Identification Test Final Score (AUDIT)   3       Patient-reported     Goals, Interventions and Follow-up Plan Goals: Cope with loneliness and navigate dating  Interventions: Mindfulness or Relaxation Training, Behavioral Activation, and CBT Cognitive Behavioral Therapy Follow-up Plan:  2 weeks  Reuel Boom

## 2023-04-02 ENCOUNTER — Ambulatory Visit: Payer: BC Managed Care – PPO | Admitting: Professional Counselor

## 2023-04-08 ENCOUNTER — Other Ambulatory Visit: Payer: Self-pay | Admitting: Family Medicine

## 2023-04-13 ENCOUNTER — Other Ambulatory Visit: Payer: Self-pay | Admitting: Family Medicine

## 2023-04-13 MED ORDER — TIRZEPATIDE 12.5 MG/0.5ML ~~LOC~~ SOAJ: 12.5000 mg | SUBCUTANEOUS | 0 refills | Status: DC

## 2023-04-19 ENCOUNTER — Other Ambulatory Visit: Payer: Self-pay | Admitting: Family Medicine

## 2023-04-23 ENCOUNTER — Ambulatory Visit: Payer: BC Managed Care – PPO | Admitting: Professional Counselor

## 2023-04-27 ENCOUNTER — Institutional Professional Consult (permissible substitution): Payer: BC Managed Care – PPO | Admitting: Nurse Practitioner

## 2023-05-10 ENCOUNTER — Encounter (HOSPITAL_COMMUNITY): Payer: Self-pay | Admitting: Registered Nurse

## 2023-05-10 ENCOUNTER — Ambulatory Visit (INDEPENDENT_AMBULATORY_CARE_PROVIDER_SITE_OTHER): Payer: BC Managed Care – PPO | Admitting: Registered Nurse

## 2023-05-10 VITALS — BP 128/86 | HR 104 | Ht 64.0 in | Wt 190.0 lb

## 2023-05-10 DIAGNOSIS — F4323 Adjustment disorder with mixed anxiety and depressed mood: Secondary | ICD-10-CM

## 2023-05-10 DIAGNOSIS — F411 Generalized anxiety disorder: Secondary | ICD-10-CM | POA: Diagnosis not present

## 2023-05-10 DIAGNOSIS — F330 Major depressive disorder, recurrent, mild: Secondary | ICD-10-CM

## 2023-05-10 MED ORDER — VENLAFAXINE HCL ER 37.5 MG PO CP24: 37.5000 mg | ORAL_CAPSULE | Freq: Every day | ORAL | 1 refills | Status: DC

## 2023-05-10 MED ORDER — HYDROXYZINE HCL 10 MG PO TABS: 10.0000 mg | ORAL_TABLET | Freq: Three times a day (TID) | ORAL | 1 refills | Status: DC | PRN

## 2023-05-10 MED ORDER — DULOXETINE HCL 60 MG PO CPEP: 60.0000 mg | ORAL_CAPSULE | Freq: Every day | ORAL | 1 refills | Status: DC

## 2023-05-10 MED ORDER — VENLAFAXINE HCL ER 150 MG PO CP24: ORAL_CAPSULE | ORAL | 1 refills | Status: DC

## 2023-05-10 NOTE — Progress Notes (Signed)
 Psychiatric Initial Adult Assessment   Patient Identification: Sabrina Waters MRN:  [id number] Date of Evaluation:  05/10/2023 Referral Source: Reuel Boom Counselor [facility] [place] Chief Complaint:   Chief Complaint  Patient presents with   Establish Care    Medication management   Visit Diagnosis:    ICD-10-CM   1. Adjustment disorder with mixed anxiety and depressed mood  F43.23     2. GAD (generalized anxiety disorder)  F41.1 hydrOXYzine (ATARAX) 10 MG tablet    DULoxetine (CYMBALTA) 60 MG capsule    venlafaxine XR (EFFEXOR-XR) 150 MG 24 hr capsule    venlafaxine XR (EFFEXOR XR) 37.5 MG 24 hr capsule    3. Major depressive disorder, recurrent episode, mild (HCC)  F33.0 DULoxetine (CYMBALTA) 60 MG capsule    venlafaxine XR (EFFEXOR-XR) 150 MG 24 hr capsule    venlafaxine XR (EFFEXOR XR) 37.5 MG 24 hr capsule      History of Present Illness:  [name] 53 y.o. female presents to office today to establish care for medication management.  She is seen face to face by this provider, and chart reviewed on 05/10/23.  Her psychiatric history includes general anxiety disorder and major depressive disorder.  She reports she was referred by her counselor Reuel Boom "  I don't feel like my meds are working most days and my doctor at [facility] (OB/GYN) was prescribing but my primary care doctor added the last medication and he felt I should see a psychiatrist to evaluate my medicines and to have one person managing my medications."  She reports her current psychotropic medications are Effexor XR 150 mg daily, Cymbalta 60 mg daily, and Vistaril 10 mg Tid prn.  She reports she has been taking Effexor XR for 10 years and Cymbalta was added "about 1-2 years ago.  Reports both medications worked well at first but it has been at least a year since any adjustments.  I don't feel my meds are working most day, When the Cymbalta was added I felt better for a little while but now I just feel like  I'm taking pills just to be taking them."  She reports she is compliant with medications and takes as prescribed.  Reports she only takes the Vistaril as needed "which is very seldom, and only when I'm on edge."  Reports no adverse reaction to either medication.  Reports she had tried several medications in the past (Buspar) but unable to name all.  Current stressor:  "My job is stressful.  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  My 42 y/o niece and her two daughters a 9 y/o and a 92 y/o has moved in with me.  I was by my self and use to the peace and quite.  Now disruptive.  I'm not use to having all the noise or anyone around.  I love them and don't mind, just the noise, mainly the 58 y/o.  My mother has dementia that is getting worse, and I have that to deal with to; and I guess it's just life in general.  I can't find a significant other.  Well I guess I could.  I just don't want to be hurt anymore."  She reports she has been divorced for 10 years, and was in another relationship that ended 3 years ago and has been living alone since then.  She reports she has seven siblings.  One brother has passed, but she  is closer to one of her sister.  All live close by but mainly see during holidays.  The niece that is currently living with her "Her mother is living in the house with my mother and we thought it would be best for her and the kids to stay with me instead of moving in the house with my mom."  She states she hopes they will only be in the home with her for a couple of weeks or "month or so."     She denies prior suicide attempt, self-harming behaviors, and psychiatric hospitalization.  She reports she has seen a counselor in the past "when me and my husband first divorced.  I only had a couple of sessions because of insurance but it helped."   Patient reports she is employed full time and was living alone up to 5 weeks ago.  She is a current smoker (1 pk/day), but denies  illicit drug use and alcohol use occasionally "not very often."  Today she denies suicidal/self-harm/homicidal ideation, psychosis, paranoia, mood fluctuations, and abnormal movements.  Doesn't feel her medications is working most like related to the recent changes in her job, her niece, and 3 children moving in with her and adjusting to the new situations.  PHQ 2/9, C-SSRS, GAD 7, AUDIT, and AIMS screenings conducted during this visit.  PHQ 2/9 score 1 and GAD 7 score 4, see other scores below.  She reports her sleep is "okay but if I need something for sleep I will take a Melatonin 3 mg and it works."  Eating without difficult.   Discussed current medications and options of changing to another medications, taper off of either the Cymbalta or Effexor XR or since the medications have mostly been working up to recently could increase one of the medications to see how she does.  Agrees with increasing one of her medications since they have worked well in past.    Recommended the following medication changes:  Increase Effexor XR to 187.5 mg daily, continue Cymbalta 60 mg daily, continue Vistaril 10 mg Tid prn.  Follow up in 2 weeks and if feel there has been no improvement will look at starting taper of Effexor XR or Cymbalta and staring another antidepressant.   Informed of side effect/efficacy, also informed it may take a couple of weeks before notable improvements may be seen.  She expresses understanding with information being communicated to given to her today and is agreeable to recommendations.  Associated Signs/Symptoms: Depression Symptoms:  depressed mood, anhedonia, anxiety, (Hypo) Manic Symptoms:  Irritable Mood, Anxiety Symptoms:  Excessive Worry, Psychotic Symptoms:   Denies PTSD Symptoms: NA  Past Psychiatric History: General anxiety, Major depression  Previous Psychotropic Medications: Yes   Substance Abuse History in the last 12 months:  No.  Consequences of Substance  Abuse: NA  Past Medical History:  Past Medical History:  Diagnosis Date   Anxiety    Depression    Elevated cholesterol    Hormone replacement therapy (HRT)    LGSIL on Pap smear of cervix 11/20/2019   HPV negative, repeat in 3 years per [facility], CIN 3+risk is 0.25 %   Prediabetes 11/20/2019   RUQ pain 01/11/2013   Sleep apnea 09/29/2016   Had mild to moderate sleep apnea on home study needs CPAP titration study per Dr Gerilyn Pilgrim     Past Surgical History:  Procedure Laterality Date   ENDOMETRIAL ABLATION      Family Psychiatric History: Sister/bipolar disorder, Mother/anxiety "My niece that is  living with me is seeing a therapist for depression and anxiety."  Family History:  Family History  Problem Relation Age of Onset   Hypertension Mother    Dementia Mother    Bipolar disorder Sister    Thyroid cancer Sister    Hypertension Brother    Cancer Brother    Depression Niece    Anxiety disorder Niece     Social History:   Social History   Socioeconomic History   Marital status: Divorced    Spouse name: Not on file   Number of children: 1   Years of education: Not on file   Highest education level: GED or equivalent  Occupational History   Not on file  Tobacco Use   Smoking status: Every Day    Current packs/day: 1.00    Average packs/day: 0.8 packs/day for 60.2 years (45.2 ttl pk-yrs)    Types: Cigarettes    Start date: 1995   Smokeless tobacco: Never  Vaping Use   Vaping status: Never Used  Substance and Sexual Activity   Alcohol use: Not Currently    Comment: socially   Drug use: No   Sexual activity: Not Currently    Birth control/protection: Surgical    Comment: ablation  Other Topics Concern   Not on file  Social History Narrative   Not on file   Social Drivers of Health   Financial Resource Strain: Low Risk  (03/01/2023)   Overall Financial Resource Strain (CARDIA)    Difficulty of Paying Living Expenses: Not very hard  Food Insecurity: No Food  Insecurity (03/01/2023)   Hunger Vital Sign    Worried About Running Out of Food in the Last Year: Never true    Ran Out of Food in the Last Year: Never true  Transportation Needs: No Transportation Needs (03/01/2023)   PRAPARE - Administrator, Civil Service (Medical): No    Lack of Transportation (Non-Medical): No  Physical Activity: Insufficiently Active (03/01/2023)   Exercise Vital Sign    Days of Exercise per Week: 1 day    Minutes of Exercise per Session: 10 min  Stress: No Stress Concern Present (03/01/2023)   [employer] of Occupational Health - Occupational Stress Questionnaire    Feeling of Stress : Only a little  Social Connections: Moderately Isolated (03/01/2023)   Social Connection and Isolation Panel [NHANES]    Frequency of Communication with Friends and Family: Three times a week    Frequency of Social Gatherings with Friends and Family: Twice a week    Attends Religious Services: 1 to 4 times per year    Active Member of [employer] or Organizations: No    Attends Banker Meetings: Never    Marital Status: Divorced    Allergies:   Allergies  Allergen Reactions   Sulfa Antibiotics Nausea And Vomiting   Labs Reviewed: Metabolic Disorder Labs: Lab Results  Component Value Date   HGBA1C 5.7 (H) 12/16/2022   No results found for: "PROLACTIN" Lab Results  Component Value Date   CHOL 182 12/16/2022   TRIG 199 (H) 12/16/2022   HDL 35 (L) 12/16/2022   CHOLHDL 5.2 (H) 12/16/2022   VLDL 46 (H) 01/11/2013   LDLCALC 112 (H) 12/16/2022   LDLCALC 158 (H) 07/03/2022   Lab Results  Component Value Date   TSH 1.440 07/03/2022   Most Recent EKG 07/17/2016 Vent. rate 94 BPM PR interval * ms QRS duration 82 ms QT/QTc 353/442 ms P-R-T axes 66 46 62  Sinus rhythm Low voltage, precordial leads no acute changes No significant change when compared to 06/17/2015  Current Medications: Current Outpatient Medications  Medication Sig Dispense  Refill   fluticasone (FLONASE) 50 MCG/ACT nasal spray Place 2 sprays into both nostrils daily. 16 g 1   pravastatin (PRAVACHOL) 80 MG tablet TAKE 1 TABLET(80 MG) BY MOUTH DAILY 30 tablet 3   tirzepatide (MOUNJARO) 12.5 MG/0.5ML Pen Inject 12.5 mg into the skin once a week. 6 mL 0   venlafaxine XR (EFFEXOR XR) 37.5 MG 24 hr capsule Take 1 capsule (37.5 mg total) by mouth daily with breakfast. 30 capsule 1   DULoxetine (CYMBALTA) 60 MG capsule Take 1 capsule (60 mg total) by mouth daily. 30 capsule 1   hydrOXYzine (ATARAX) 10 MG tablet Take 1 tablet (10 mg total) by mouth 3 (three) times daily as needed. for anxiety 60 tablet 1   levocetirizine (XYZAL) 5 MG tablet Take 1 tablet (5 mg total) by mouth every evening. (Patient not taking: Reported on 05/10/2023) 30 tablet 1   pseudoephedrine (SUDAFED) 120 MG 12 hr tablet Take 1 tablet (120 mg total) by mouth 2 (two) times daily. (Patient not taking: Reported on 05/10/2023) 20 tablet 0   venlafaxine XR (EFFEXOR-XR) 150 MG 24 hr capsule TAKE 1 CAPSULE(150 MG) BY MOUTH DAILY WITH BREAKFAST 30 capsule 1   No current facility-administered medications for this visit.    Musculoskeletal: Strength & Muscle Tone: within normal limits Gait & Station: normal Patient leans: N/A  Psychiatric Specialty Exam: Review of Systems  Constitutional:        No other complaints voiced  Psychiatric/Behavioral:  Positive for dysphoric mood. Negative for agitation, decreased concentration, hallucinations, self-injury, sleep disturbance and suicidal ideas. The patient is nervous/anxious.   All other systems reviewed and are negative.   Blood pressure 128/86, pulse (!) 104, height 5\' 4"  (1.626 m), weight 190 lb (86.2 kg), SpO2 97%.Body mass index is 32.61 kg/m.  General Appearance: Casual and Neat  Eye Contact:  Good  Speech:  Clear and Coherent and Normal Rate  Volume:  Normal  Mood:  Euthymic  Affect:  Appropriate and Congruent  Thought Process:  Coherent, Goal  Directed, and Descriptions of Associations: Intact  Orientation:  Full (Time, Place, and Person)  Thought Content:  WDL and Logical  Suicidal Thoughts:  No  Homicidal Thoughts:  No  Memory:  Immediate;   Good Recent;   Good Remote;   Good  Judgement:  Intact  Insight:  Good and Present  Psychomotor Activity:  Normal  Concentration:  Concentration: Good and Attention Span: Good  Recall:  Good  Fund of Knowledge:Good  Language: Good  Akathisia:  No  Handed:  Right  AIMS (if indicated):  done  Assets:  Communication Skills Desire for Improvement Financial Resources/Insurance Housing Leisure Time Physical Health Resilience Social Support Transportation  ADL's:  Intact  Cognition: WNL  Sleep:  Good   Screenings: AIMS    Flowsheet Row Office Visit from 05/10/2023 in [facility] [place]  AIMS Total Score 0      AUDIT    Flowsheet Row Integrated Behavioral Health from 08/06/2022 in [facility]  Alcohol Use Disorder Identification Test Final Score (AUDIT) 3      GAD-7    Flowsheet Row Office Visit from 05/10/2023 in [facility] from 03/19/2023 in [facility] Care Office Visit from 11/23/2022 in [facility]  Primary Care Office Visit from 10/29/2022 in [facility] Integrated Behavioral Health from 10/23/2022 in [facility] Care  Total GAD-7 Score 4 6 2 6 4       PHQ2-9    Flowsheet Row Office Visit from 05/10/2023 in [facility] at [facility] from 03/19/2023 in [facility] Office Visit from 11/23/2022 in [facility] Primary Care Office Visit from 10/29/2022 in [facility] Integrated Behavioral Health from 10/23/2022 in [facility] Primary Care  PHQ-2 Total  Score 1 3 1 2 2   PHQ-9 Total Score -- 9 4 9 10       Flowsheet Row Office Visit from 05/10/2023 in [facility] at [facility] from 03/19/2023 in [facility] Primary Care Integrated Behavioral Health from 10/23/2022 in [facility] Primary Care  C-SSRS RISK CATEGORY No Risk No Risk No Risk       Assessment and Plan: Magdeline Prange Sow appears to be doing fairly well.  Reporting she has been on current psychotropic medications for years and doesn't feel that they rare working.  She has also had recent changes in her life with her niece and 2 children moving in with her, also changes at work that is causing more stress.  Possibility with the recent changes and patient adjusting to changes there has been an increase in depression/anxiety symptoms.  She endorses some irritability/annoyance at time but not often.  She denies suicidal/self-harm/homicidal ideation, psychosis, paranoia, mood fluctuations, and abnormal movements.  She reports sleeping well and if feels she needs something for sleep she will take Melatonin 3 mg as needed.  Reports eating without difficulty.      During visit she is dressed appropriate for age and weather.  She is sitting upright on couch with no noted distress.  She is alert/oriented x 4, calm, cooperative, and pleasant.  He mood is anxious but euthymic with congruent affect.  She spoke in a clear tone at moderate volume, and normal pace, with good eye contact.  Her thought process is coherent, relevant, and there is no indication that she is currently responding to internal/external stimuli, or experiencing delusional thought content.   Plan: Increased Effexor XR 187.5 mg daily (150 mg tablet and 37.5 mg tablet) to be taken together daily for depression and anxiety Continued Cymbalta 60 mg daily for depression and anxiety Continued Vistaril 10 mg Tid prn anxiety Continue counseling Follow up for medication  management in 2 weeks if no improvements will look at Effexor XR or Cymbalta taper and starting another antidepressant.   She is also instructed to call 911, [phone], mobile crisis or present to the nearest emergency room should she experience any suicidal/homicidal ideation, auditory/visual/hallucinations, or detrimental worsening of her mental health condition.  1. GAD (generalized anxiety disorder) - hydrOXYzine (ATARAX) 10 MG tablet; Take 1 tablet (10 mg total) by mouth 3 (three) times daily as needed. for anxiety  Dispense: 60 tablet; Refill: 1 - DULoxetine (CYMBALTA) 60 MG capsule; Take 1 capsule (60 mg total) by mouth daily.  Dispense: 30 capsule; Refill: 1 - venlafaxine XR (EFFEXOR-XR) 150 MG 24 hr capsule; TAKE 1 CAPSULE(150 MG) BY MOUTH DAILY WITH BREAKFAST  Dispense: 30 capsule; Refill: 1 - venlafaxine XR (EFFEXOR XR) 37.5 MG 24 hr capsule; Take 1 capsule (37.5 mg total) by mouth daily with breakfast.  Dispense: 30 capsule; Refill: 1  2. Major depressive disorder, recurrent  episode, mild (HCC) - DULoxetine (CYMBALTA) 60 MG capsule; Take 1 capsule (60 mg total) by mouth daily.  Dispense: 30 capsule; Refill: 1 - venlafaxine XR (EFFEXOR-XR) 150 MG 24 hr capsule; TAKE 1 CAPSULE(150 MG) BY MOUTH DAILY WITH BREAKFAST  Dispense: 30 capsule; Refill: 1 - venlafaxine XR (EFFEXOR XR) 37.5 MG 24 hr capsule; Take 1 capsule (37.5 mg total) by mouth daily with breakfast.  Dispense: 30 capsule; Refill: 1  3. Adjustment disorder with mixed anxiety and depressed mood (Primary) - hydrOXYzine (ATARAX) 10 MG tablet; Take 1 tablet (10 mg total) by mouth 3 (three) times daily as needed. for anxiety  Dispense: 60 tablet; Refill: 1 - DULoxetine (CYMBALTA) 60 MG capsule; Take 1 capsule (60 mg total) by mouth daily.  Dispense: 30 capsule; Refill: 1 - venlafaxine XR (EFFEXOR-XR) 150 MG 24 hr capsule; TAKE 1 CAPSULE(150 MG) BY MOUTH DAILY WITH BREAKFAST  Dispense: 30 capsule; Refill: 1 - venlafaxine XR (EFFEXOR XR) 37.5  MG 24 hr capsule; Take 1 capsule (37.5 mg total) by mouth daily with breakfast.  Dispense: 30 capsule; Refill: 1    Collaboration of Care: Medication Management AEB Adjustment of medications and refills Meds ordered this encounter  Medications   hydrOXYzine (ATARAX) 10 MG tablet    Sig: Take 1 tablet (10 mg total) by mouth 3 (three) times daily as needed. for anxiety    Dispense:  60 tablet    Refill:  1    Supervising Provider:   Kathryne Sharper T [[id number]]   DULoxetine (CYMBALTA) 60 MG capsule    Sig: Take 1 capsule (60 mg total) by mouth daily.    Dispense:  30 capsule    Refill:  1    Supervising Provider:   Lolly Mustache, SYED T [[id number]]   venlafaxine XR (EFFEXOR-XR) 150 MG 24 hr capsule    Sig: TAKE 1 CAPSULE(150 MG) BY MOUTH DAILY WITH BREAKFAST    Dispense:  30 capsule    Refill:  1    Supervising Provider:   Kathryne Sharper T [[id number]]   venlafaxine XR (EFFEXOR XR) 37.5 MG 24 hr capsule    Sig: Take 1 capsule (37.5 mg total) by mouth daily with breakfast.    Dispense:  30 capsule    Refill:  1    Supervising Provider:   Kathryne Sharper T [[id number]]    Patient/Guardian was advised Release of Information must be obtained prior to any record release in order to collaborate their care with an outside provider. Patient/Guardian was advised if they have not already done so to contact the registration department to sign all necessary forms in order for [place] to release information regarding their care.   Consent: Patient/Guardian gives verbal consent for treatment and assignment of benefits for services provided during this visit. Patient/Guardian expressed understanding and agreed to proceed.   Ralphie Lovelady, NP 3/10/202512:00 PM

## 2023-05-10 NOTE — Patient Instructions (Addendum)
 Call 911, [phone], mobile crisis, or present to the nearest emergency room should you experience any suicidal/homicidal ideation, auditory/visual/hallucinations, or detrimental worsening of your mental health.  Mobile Crisis Response Teams Listed by counties in vicinity of [facility] providers [facility] [phone] [facility] [phone] [facility] [phone] [facility] [phone] [place]                * [facility] [phone]                * [facility] [phone]  [facility] [phone] [facility] [employer]  [phone] * [employer] [phone]

## 2023-05-24 ENCOUNTER — Encounter (HOSPITAL_COMMUNITY): Payer: Self-pay | Admitting: Registered Nurse

## 2023-05-24 ENCOUNTER — Telehealth (INDEPENDENT_AMBULATORY_CARE_PROVIDER_SITE_OTHER): Admitting: Registered Nurse

## 2023-05-24 DIAGNOSIS — F4323 Adjustment disorder with mixed anxiety and depressed mood: Secondary | ICD-10-CM

## 2023-05-24 DIAGNOSIS — F411 Generalized anxiety disorder: Secondary | ICD-10-CM | POA: Diagnosis not present

## 2023-05-24 DIAGNOSIS — F330 Major depressive disorder, recurrent, mild: Secondary | ICD-10-CM

## 2023-05-24 MED ORDER — HYDROXYZINE HCL 10 MG PO TABS: 10.0000 mg | ORAL_TABLET | Freq: Three times a day (TID) | ORAL | 1 refills | Status: DC | PRN

## 2023-05-24 MED ORDER — DULOXETINE HCL 60 MG PO CPEP: 60.0000 mg | ORAL_CAPSULE | Freq: Every day | ORAL | 1 refills | Status: DC

## 2023-05-24 MED ORDER — FLUOXETINE HCL 10 MG PO CAPS: 10.0000 mg | ORAL_CAPSULE | Freq: Every day | ORAL | 1 refills | Status: DC

## 2023-05-24 MED ORDER — VENLAFAXINE HCL 100 MG PO TABS: ORAL_TABLET | ORAL | 0 refills | Status: DC

## 2023-05-24 MED ORDER — VENLAFAXINE HCL 25 MG PO TABS: ORAL_TABLET | ORAL | 0 refills | Status: DC

## 2023-05-24 NOTE — Patient Instructions (Signed)
 Start Slow taper or Effexor.  Take the following dosages:  125 mg for five days 100 mg for five days 75 mg for five days 50 mg for five days  Call 911, [phone], mobile crisis, or present to the nearest emergency room should you experience any suicidal/homicidal ideation, auditory/visual/hallucinations, or detrimental worsening of your mental health.  Mobile Crisis Response Teams Listed by counties in vicinity of [facility] providers [facility] [phone] [facility] [phone] [facility] [phone] [facility] [phone] [place]                * [place] [facility] [phone]                * [facility] [phone]  [facility] [phone] [facility] [employer]  [phone] * [employer] [phone]

## 2023-05-24 NOTE — Progress Notes (Signed)
 [facility] MD/PA/NP OP Progress Note  05/24/2023 11:53 AM Sabrina Waters  MRN:  [id number]  Virtual Visit via Video Note  I connected with Sabrina Waters on 05/24/23 at 11:00 AM EDT by a video enabled telemedicine application and verified that I am speaking with the correct person using two identifiers.  Location: Patient: Work Provider: [facility] [place]   I discussed the limitations of evaluation and management by telemedicine and the availability of in person appointments. The patient expressed understanding and agreed to proceed.   I discussed the assessment and treatment plan with the patient. The patient was provided an opportunity to ask questions and all were answered. The patient agreed with the plan and demonstrated an understanding of the instructions.   The patient was advised to call back or seek an in-person evaluation if the symptoms worsen or if the condition fails to improve as anticipated.  I provided 30 minutes of non-face-to-face time during this encounter.   Assunta Found, NP   Chief Complaint: No chief complaint on file.  HPI: Sabrina Waters 53 y.o. female presents today for medication management follow up.  She is seen via virtual video visit by this provider, and chart reviewed on 05/24/23.  Her psychiatric history is significant for general anxiety and major depression.  Her mental health is currently managed with Effexor XR 150 mg daily, Cymbalta 60 mg daily, and vistaril 10 mg Tid prn.  She reports that current medication regimen is not working and there has been no improvement in her mental health.  She continues to have symptoms of depression and anxiety.  Effexor was increased during last visit but reports "I stopped because it was making me feel worse.  I took it for bout a week and stopped taking it."  Reports she felt more depressed, tired, irritable "I just wasn't a happy person."  Reports that her job continues to be the main stressor with the new changes  that are occurring and states "I expect this will be going on for the next 6 months at least."  Reports that her niece and her 2 children moved back home "She couldn't get out of her lease, and she said if she was [place] have to pay for it she might as well move back in.  Them moving out was a big stress relief."  Reports she is eating without difficulty.  States she is sleeping fine "and if I do have trouble sleeping, I just take melatonin and sleep fine."  Today she denies suicidal/self-harm/homicidal ideation, psychosis, paranoia, and abnormal movements. Patient reports she has taken Zoloft, Paxil, and Buspar in the past.  Reports she was on prior medications and current medications for years before "they stopped working.  Reports the only medication that didn't work for her was the Paxil "I didn't like how it mad me feel."       Recommended the following medication changes.   Slow taper on Effexor XR:  Starting today decrease Effexor XR to 125 mg daily for 5 days, then 100 mg for 5 days, 75 for 5 days, 50 for 5 days, 25 for 5 days.  Start Prozac 10 mg daily.  Continue Cymbalta 60 mg daily, and Continue Vistaril 10 mg Tid prn.    Visit Diagnosis:    ICD-10-CM   1. Major depressive disorder, recurrent episode, mild (HCC)  F33.0 venlafaxine (EFFEXOR) 100 MG tablet    venlafaxine (EFFEXOR) 25 MG tablet    FLUoxetine (PROZAC) 10 MG capsule  DULoxetine (CYMBALTA) 60 MG capsule    2. GAD (generalized anxiety disorder)  F41.1 FLUoxetine (PROZAC) 10 MG capsule    DULoxetine (CYMBALTA) 60 MG capsule    hydrOXYzine (ATARAX) 10 MG tablet    3. Adjustment disorder with mixed anxiety and depressed mood  F43.23 FLUoxetine (PROZAC) 10 MG capsule    DULoxetine (CYMBALTA) 60 MG capsule    hydrOXYzine (ATARAX) 10 MG tablet      Past Psychiatric History: GAD, MDD  Past Medical History:  Past Medical History:  Diagnosis Date   Anxiety    Depression    Elevated cholesterol    Hormone replacement  therapy (HRT)    LGSIL on Pap smear of cervix 11/20/2019   HPV negative, repeat in 3 years per [facility], CIN 3+risk is 0.25 %   Prediabetes 11/20/2019   RUQ pain 01/11/2013   Sleep apnea 09/29/2016   Had mild to moderate sleep apnea on home study needs CPAP titration study per Dr Gerilyn Pilgrim     Past Surgical History:  Procedure Laterality Date   ENDOMETRIAL ABLATION      Family Psychiatric History: See below in family history  Family History:  Family History  Problem Relation Age of Onset   Hypertension Mother    Dementia Mother    Bipolar disorder Sister    Thyroid cancer Sister    Hypertension Brother    Cancer Brother    Depression Niece    Anxiety disorder Niece     Social History:  Social History   Socioeconomic History   Marital status: Divorced    Spouse name: Not on file   Number of children: 1   Years of education: Not on file   Highest education level: GED or equivalent  Occupational History   Not on file  Tobacco Use   Smoking status: Every Day    Current packs/day: 1.00    Average packs/day: 0.8 packs/day for 60.2 years (45.2 ttl pk-yrs)    Types: Cigarettes    Start date: 1995   Smokeless tobacco: Never  Vaping Use   Vaping status: Never Used  Substance and Sexual Activity   Alcohol use: Not Currently    Comment: socially   Drug use: No   Sexual activity: Not Currently    Birth control/protection: Surgical    Comment: ablation  Other Topics Concern   Not on file  Social History Narrative   Not on file   Social Drivers of Health   Financial Resource Strain: Low Risk  (03/01/2023)   Overall Financial Resource Strain (CARDIA)    Difficulty of Paying Living Expenses: Not very hard  Food Insecurity: No Food Insecurity (03/01/2023)   Hunger Vital Sign    Worried About Running Out of Food in the Last Year: Never true    Ran Out of Food in the Last Year: Never true  Transportation Needs: No Transportation Needs (03/01/2023)   PRAPARE - Therapist, art (Medical): No    Lack of Transportation (Non-Medical): No  Physical Activity: Insufficiently Active (03/01/2023)   Exercise Vital Sign    Days of Exercise per Week: 1 day    Minutes of Exercise per Session: 10 min  Stress: No Stress Concern Present (03/01/2023)   [employer] of Occupational Health - Occupational Stress Questionnaire    Feeling of Stress : Only a little  Social Connections: Moderately Isolated (03/01/2023)   Social Connection and Isolation Panel [NHANES]    Frequency of Communication with Friends and  Family: Three times a week    Frequency of Social Gatherings with Friends and Family: Twice a week    Attends Religious Services: 1 to 4 times per year    Active Member of [employer] or Organizations: No    Attends Banker Meetings: Never    Marital Status: Divorced    Allergies:  Allergies  Allergen Reactions   Sulfa Antibiotics Nausea And Vomiting    Metabolic Disorder Labs: Lab Results  Component Value Date   HGBA1C 5.7 (H) 12/16/2022   No results found for: "PROLACTIN" Lab Results  Component Value Date   CHOL 182 12/16/2022   TRIG 199 (H) 12/16/2022   HDL 35 (L) 12/16/2022   CHOLHDL 5.2 (H) 12/16/2022   VLDL 46 (H) 01/11/2013   LDLCALC 112 (H) 12/16/2022   LDLCALC 158 (H) 07/03/2022   Lab Results  Component Value Date   TSH 1.440 07/03/2022   TSH 1.310 11/15/2019    Current Medications: Current Outpatient Medications  Medication Sig Dispense Refill   FLUoxetine (PROZAC) 10 MG capsule Take 1 capsule (10 mg total) by mouth daily. 30 capsule 1   venlafaxine (EFFEXOR) 100 MG tablet Start Slow taper.   125 mg for five days 100 mg for five days 75 mg for five days 50 mg for five days 25 mg for five days 30 tablet 0   venlafaxine (EFFEXOR) 25 MG tablet Start Slow taper.   125 mg for five days 100 mg for five days 75 mg for five days 50 mg for five days 40 tablet 0   DULoxetine (CYMBALTA) 60 MG capsule Take  1 capsule (60 mg total) by mouth daily. 30 capsule 1   fluticasone (FLONASE) 50 MCG/ACT nasal spray Place 2 sprays into both nostrils daily. 16 g 1   hydrOXYzine (ATARAX) 10 MG tablet Take 1 tablet (10 mg total) by mouth 3 (three) times daily as needed. for anxiety 60 tablet 1   levocetirizine (XYZAL) 5 MG tablet Take 1 tablet (5 mg total) by mouth every evening. (Patient not taking: Reported on 05/10/2023) 30 tablet 1   pravastatin (PRAVACHOL) 80 MG tablet TAKE 1 TABLET(80 MG) BY MOUTH DAILY 30 tablet 3   pseudoephedrine (SUDAFED) 120 MG 12 hr tablet Take 1 tablet (120 mg total) by mouth 2 (two) times daily. (Patient not taking: Reported on 05/10/2023) 20 tablet 0   tirzepatide (MOUNJARO) 12.5 MG/0.5ML Pen Inject 12.5 mg into the skin once a week. 6 mL 0   No current facility-administered medications for this visit.     Musculoskeletal: Strength & Muscle Tone:  Unable to assess via virtual visit Gait & Station: normal Patient leans: N/A  Psychiatric Specialty Exam: Review of Systems  Constitutional:        No other complaints voiced  Psychiatric/Behavioral:  Positive for dysphoric mood. Negative for agitation, hallucinations, self-injury, sleep disturbance and suicidal ideas. The patient is nervous/anxious. The patient is not hyperactive.   All other systems reviewed and are negative.   There were no vitals taken for this visit.There is no height or weight on file to calculate BMI.  General Appearance: Casual  Eye Contact:  Good  Speech:  Clear and Coherent and Normal Rate  Volume:  Normal  Mood:  Anxious  Affect:  Congruent  Thought Process:  Coherent, Goal Directed, and Descriptions of Associations: Intact  Orientation:  Full (Time, Place, and Person)  Thought Content: WDL and Logical   Suicidal Thoughts:  No  Homicidal Thoughts:  No  Memory:  Immediate;   Good Recent;   Good Remote;   Good  Judgement:  Intact  Insight:  Good and Present  Psychomotor Activity:  Normal   Concentration:  Concentration: Good and Attention Span: Good  Recall:  Good  Fund of Knowledge: Good  Language: Good  Akathisia:  No  Handed:  Right  AIMS (if indicated): not done  Assets:  Communication Skills Desire for Improvement Financial Resources/Insurance Housing Leisure Time Physical Health Resilience Social Support Transportation  ADL's:  Intact  Cognition: WNL  Sleep:  Good   Screenings: AIMS    Loss adjuster, chartered Office Visit from 05/10/2023 in [facility] [place]  AIMS Total Score 0      AUDIT    Flowsheet Row Integrated Behavioral Health from 08/06/2022 in [facility]  Alcohol Use Disorder Identification Test Final Score (AUDIT) 3      GAD-7    Flowsheet Row Office Visit from 05/10/2023 in [facility] from 03/19/2023 in [facility] Office Visit from 11/23/2022 in [facility] Office Visit from 10/29/2022 in [facility] Integrated Behavioral Health from 10/23/2022 in [facility]  Total GAD-7 Score 4 6 2 6 4       PHQ2-9    Flowsheet Row Office Visit from 05/10/2023 in [facility] from 03/19/2023 in [facility] Office Visit from 11/23/2022 in [facility] Office Visit from 10/29/2022 in [facility] Integrated Behavioral Health from 10/23/2022 in [facility]  PHQ-2 Total Score 1 3 1 2 2   PHQ-9 Total Score -- 9 4 9 10       Flowsheet Row Office Visit from 05/10/2023 in [facility] from 03/19/2023 in [facility] Integrated Behavioral Health from 10/23/2022 in [facility]   C-SSRS RISK CATEGORY No Risk No Risk No Risk      Assessment and Plan:   Assessment: Patient seen and examined as noted above. Summary: Today Sabrina Waters reports there has been no improvement in her depression or anxiety.  Was unable to tolerate the increased dose of Effexor XR so converted back to her original dose of 150 mg daily.  Reports some stress relief since her niece and her 2 children have moved out of her home.  Today reports eating and sleeping without difficulty.  She denies suicidal/self-harm/homicidal ideation, psychosis, paranoia, and fluctuations in mood.    During visit she is dressed appropriate for age and weather.  She is sitting standing leaning over dest at work.  There is no noted distress.  She is alert/oriented x 4, calm/cooperative and mood is congruent with affect.  She spoke in a clear tone at moderate volume, and normal pace, with good eye contact.  Her thought process is coherent, relevant, and there is no indication that she is currently responding to internal/external stimuli or experiencing delusional thought content.    1. GAD (generalized anxiety disorder) - FLUoxetine (PROZAC) 10 MG capsule; Take 1 capsule (10 mg total) by mouth daily.  Dispense: 30 capsule; Refill: 1 - DULoxetine (CYMBALTA) 60 MG capsule; Take 1 capsule (60 mg total) by mouth daily.  Dispense: 30 capsule; Refill: 1 - hydrOXYzine (ATARAX) 10 MG tablet; Take 1 tablet (10 mg  total) by mouth 3 (three) times daily as needed. for anxiety  Dispense: 60 tablet; Refill: 1  2. Major depressive disorder, recurrent episode, mild (HCC) (Primary) - venlafaxine (EFFEXOR) 100 MG tablet; Start Slow taper.   125 mg for five days 100 mg for five days 75 mg for five days 50 mg for five days 25 mg for five days  Dispense: 30 tablet; Refill: 0 - venlafaxine (EFFEXOR) 25 MG tablet; Start Slow taper.   125 mg for five days 100 mg for five days 75 mg for five days 50 mg for five days  Dispense: 40 tablet;  Refill: 0 - FLUoxetine (PROZAC) 10 MG capsule; Take 1 capsule (10 mg total) by mouth daily.  Dispense: 30 capsule; Refill: 1 - DULoxetine (CYMBALTA) 60 MG capsule; Take 1 capsule (60 mg total) by mouth daily.  Dispense: 30 capsule; Refill: 1  3. Adjustment disorder with mixed anxiety and depressed mood - FLUoxetine (PROZAC) 10 MG capsule; Take 1 capsule (10 mg total) by mouth daily.  Dispense: 30 capsule; Refill: 1 - DULoxetine (CYMBALTA) 60 MG capsule; Take 1 capsule (60 mg total) by mouth daily.  Dispense: 30 capsule; Refill: 1 - hydrOXYzine (ATARAX) 10 MG tablet; Take 1 tablet (10 mg total) by mouth 3 (three) times daily as needed. for anxiety  Dispense: 60 tablet; Refill: 1   Plan: Medications: Meds ordered this encounter  Medications   venlafaxine (EFFEXOR) 100 MG tablet    Sig: Start Slow taper.   125 mg for five days 100 mg for five days 75 mg for five days 50 mg for five days 25 mg for five days    Dispense:  30 tablet    Refill:  0    Supervising Provider:   Kathryne Sharper T [[id number]]   venlafaxine (EFFEXOR) 25 MG tablet    Sig: Start Slow taper.   125 mg for five days 100 mg for five days 75 mg for five days 50 mg for five days    Dispense:  40 tablet    Refill:  0    Supervising Provider:   Kathryne Sharper T [[id number]]   FLUoxetine (PROZAC) 10 MG capsule    Sig: Take 1 capsule (10 mg total) by mouth daily.    Dispense:  30 capsule    Refill:  1    Supervising Provider:   Kathryne Sharper T [[id number]]   DULoxetine (CYMBALTA) 60 MG capsule    Sig: Take 1 capsule (60 mg total) by mouth daily.    Dispense:  30 capsule    Refill:  1    Supervising Provider:   Kathryne Sharper T [[id number]]   hydrOXYzine (ATARAX) 10 MG tablet    Sig: Take 1 tablet (10 mg total) by mouth 3 (three) times daily as needed. for anxiety    Dispense:  60 tablet    Refill:  1    Supervising Provider:   Kathryne Sharper T [[id number]]    Labs:  Not indicated at this time Other:   Continue psychotherapy instructed to call  [phone], mobile crisis, or present to the nearest emergency room should she experience any suicidal/homicidal ideation, auditory/visual/hallucinations, or detrimental worsening of her mental health condition.   Patient has participated in the development of this treatment plan and verbalized agreement with plan as listed.  Follow Up: Return in one month for medication management Call in the interim for any side-effects, decompensation, questions, or problems  Collaboration of Care: Collaboration of Care: Medication Management AEB Medication adjustment  and refill  Patient/Guardian was advised Release of Information must be obtained prior to any record release in order to collaborate their care with an outside provider. Patient/Guardian was advised if they have not already done so to contact the registration department to sign all necessary forms in order for [place] to release information regarding their care.   Consent: Patient/Guardian gives verbal consent for treatment and assignment of benefits for services provided during this visit. Patient/Guardian expressed understanding and agreed to proceed.    Sally Menard, NP 05/24/2023, 11:53 AM

## 2023-05-25 ENCOUNTER — Other Ambulatory Visit (HOSPITAL_COMMUNITY): Payer: Self-pay | Admitting: Registered Nurse

## 2023-05-25 ENCOUNTER — Telehealth (HOSPITAL_COMMUNITY): Payer: Self-pay

## 2023-05-25 DIAGNOSIS — F330 Major depressive disorder, recurrent, mild: Secondary | ICD-10-CM

## 2023-05-25 MED ORDER — VENLAFAXINE HCL 25 MG PO TABS: ORAL_TABLET | ORAL | 0 refills | Status: DC

## 2023-05-25 MED ORDER — VENLAFAXINE HCL 100 MG PO TABS: ORAL_TABLET | ORAL | 0 refills | Status: DC

## 2023-05-25 NOTE — Telephone Encounter (Signed)
 Pt called in stating that her venlafaxine (EFFEXOR) 100 MG tablet and her venlafaxine (EFFEXOR) 25 MG tablet not going to [facility] on [address]. Rx printed instead of being escribed. Please resend rxs.

## 2023-05-25 NOTE — Addendum Note (Signed)
 Addended by: Assunta Found B on: 05/25/2023 03:18 PM   Modules accepted: Orders

## 2023-05-25 NOTE — Telephone Encounter (Signed)
 Provider sent rx pt aware and verbalized understanding

## 2023-06-17 ENCOUNTER — Ambulatory Visit: Admitting: Professional Counselor

## 2023-06-18 ENCOUNTER — Ambulatory Visit: Admitting: Professional Counselor

## 2023-06-22 ENCOUNTER — Ambulatory Visit: Payer: BC Managed Care – PPO | Admitting: Nurse Practitioner

## 2023-06-24 ENCOUNTER — Telehealth (HOSPITAL_COMMUNITY): Admitting: Registered Nurse

## 2023-06-25 ENCOUNTER — Ambulatory Visit: Admitting: Professional Counselor

## 2023-06-28 ENCOUNTER — Telehealth (INDEPENDENT_AMBULATORY_CARE_PROVIDER_SITE_OTHER): Admitting: Registered Nurse

## 2023-06-28 ENCOUNTER — Encounter (HOSPITAL_COMMUNITY): Payer: Self-pay | Admitting: Registered Nurse

## 2023-06-28 DIAGNOSIS — F330 Major depressive disorder, recurrent, mild: Secondary | ICD-10-CM

## 2023-06-28 DIAGNOSIS — F411 Generalized anxiety disorder: Secondary | ICD-10-CM

## 2023-06-28 MED ORDER — DULOXETINE HCL 60 MG PO CPEP: 60.0000 mg | ORAL_CAPSULE | Freq: Every day | ORAL | 1 refills | Status: DC

## 2023-06-28 MED ORDER — FLUOXETINE HCL 10 MG PO CAPS
10.0000 mg | ORAL_CAPSULE | Freq: Every day | ORAL | 1 refills | Status: DC
Start: 2023-06-28 — End: 2023-09-23

## 2023-06-28 MED ORDER — HYDROXYZINE HCL 10 MG PO TABS: 10.0000 mg | ORAL_TABLET | Freq: Three times a day (TID) | ORAL | 1 refills | Status: DC | PRN

## 2023-06-28 NOTE — Patient Instructions (Signed)

## 2023-06-28 NOTE — Progress Notes (Signed)
 [facility] MD/PA/NP OP Progress Note  06/28/2023 9:21 AM [name]  MRN:  [id number]  Virtual Visit via Video Note  I connected with Sabrina Waters on 06/28/23 at  9:00 AM EDT by a video enabled telemedicine application and verified that I am speaking with the correct person using two identifiers.  Location: Patient: Work Provider: [facility] [place]   I discussed the limitations of evaluation and management by telemedicine and the availability of in person appointments. The patient expressed understanding and agreed to proceed.   I discussed the assessment and treatment plan with the patient. The patient was provided an opportunity to ask questions and all were answered. The patient agreed with the plan and demonstrated an understanding of the instructions.   The patient was advised to call back or seek an in-person evaluation if the symptoms worsen or if the condition fails to improve as anticipated.  I provided 20 minutes of non-face-to-face time during this encounter.   [name], NP   Chief Complaint:  Chief Complaint  Patient presents with   Follow-up    Medication management   HPI: Sabrina Waters 53 y.o. female presents today for medication management follow up.  She is seen via virtual video visit by this provider, and chart reviewed on 06/28/23.  Her psychiatric history is significant for general anxiety and major depression.  Her mental health is currently managed with Cymbalta  60 mg daily, and vistaril  10 mg Tid prn, and Prozac  10 mg daily.  She reports she has completed the Effexor  taper and current medication regimen is working effectively managing her mental health without adverse reactions.  She states she feels good.  She does report that there have been more changes at her job which is her main stressor but dealing with it.  She denies suicidal/self-harm/homicidal ideation, psychosis, paranoia, mood swings, and abnormal movements.    Recommended to continue  current medication regimen with no changes at this time.  Continue psychotherapy, and follow up medication management in one month.     Visit Diagnosis:    ICD-10-CM   1. Major depressive disorder, recurrent episode, mild (HCC)  F33.0 DULoxetine  (CYMBALTA ) 60 MG capsule    FLUoxetine  (PROZAC ) 10 MG capsule    2. 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/2019   HPV negative, repeat in 3 years per [facility], CIN 3+risk is 0.25 %   Prediabetes 11/20/2019   RUQ pain 01/11/2013   Sleep apnea 09/29/2016   Had mild to moderate sleep apnea on home study needs CPAP titration study per Dr Joleen Navy     Past Surgical History:  Procedure Laterality Date   ENDOMETRIAL ABLATION      Family Psychiatric History: See below in family history  Family History:  Family History  Problem Relation Age of Onset   Hypertension Mother    Dementia Mother    Bipolar disorder Sister    Thyroid  cancer Sister    Hypertension Brother    Cancer Brother    Depression Niece    Anxiety disorder Niece     Social History:  Social History   Socioeconomic History   Marital status: Divorced    Spouse name: Not on file   Number of children: 1   Years of education: Not on file   Highest education level: GED or equivalent  Occupational History   Not on file  Tobacco Use   Smoking status: Every Day    Current packs/day: 1.00    Average packs/day: 0.8 packs/day for 60.3 years (45.3 ttl pk-yrs)    Types: Cigarettes    Start date: 1995   Smokeless tobacco: Never  Vaping Use   Vaping status: Never Used  Substance and Sexual Activity   Alcohol use: Not Currently    Comment:  socially   Drug use: No   Sexual activity: Not Currently    Birth control/protection: Surgical    Comment: ablation  Other Topics Concern   Not on file  Social History Narrative   Not on file   Social Drivers of Health   Financial Resource Strain: Low Risk  (03/01/2023)   Overall Financial Resource Strain (CARDIA)    Difficulty of Paying Living Expenses: Not very hard  Food Insecurity: No Food Insecurity (03/01/2023)   Hunger Vital Sign    Worried About Running Out of Food in the Last Year: Never true    Ran Out of Food in the Last Year: Never true  Transportation Needs: No Transportation Needs (03/01/2023)   PRAPARE - Administrator, Civil Service (Medical): No    Lack of Transportation (Non-Medical): No  Physical Activity: Insufficiently Active (03/01/2023)   Exercise Vital Sign    Days of Exercise per Week: 1 day    Minutes of Exercise per Session: 10 min  Stress: No Stress Concern Present (03/01/2023)   [employer] of Occupational Health - Occupational Stress Questionnaire    Feeling of Stress : Only a little  Social Connections: Moderately Isolated (03/01/2023)   Social Connection and Isolation Panel [NHANES]    Frequency of Communication with Friends and Family: Three times a week    Frequency of Social Gatherings with Friends and Family: Twice a week    Attends Religious Services: 1 to 4 times per year    Active Member of [employer] or Organizations: No    Attends Banker Meetings: Never    Marital Status: Divorced    Allergies:  Allergies  Allergen Reactions   Sulfa Antibiotics Nausea And Vomiting    Metabolic Disorder Labs: Lab Results  Component Value Date   HGBA1C 5.7 (H) 12/16/2022   No results found for: "PROLACTIN" Lab Results  Component Value Date   CHOL 182 12/16/2022   TRIG 199 (H) 12/16/2022   HDL 35 (L) 12/16/2022   CHOLHDL 5.2 (H) 12/16/2022   VLDL 46 (H) 01/11/2013   LDLCALC 112 (H) 12/16/2022   LDLCALC 158 (H)  07/03/2022   Lab Results  Component Value Date   TSH 1.440 07/03/2022   TSH 1.310 11/15/2019    Current Medications: Current Outpatient Medications  Medication Sig Dispense Refill   DULoxetine  (CYMBALTA ) 60 MG capsule Take 1 capsule (60 mg total) by mouth daily. 30 capsule 1   FLUoxetine  (PROZAC ) 10 MG capsule Take 1 capsule (10 mg total) by mouth daily. 30 capsule 1   fluticasone  (FLONASE ) 50 MCG/ACT nasal spray Place 2 sprays into both nostrils daily. 16 g 1   hydrOXYzine  (ATARAX ) 10 MG tablet Take 1 tablet (10 mg total) by mouth 3 (three) times daily as needed. for anxiety 60 tablet 1  levocetirizine (XYZAL ) 5 MG tablet Take 1 tablet (5 mg total) by mouth every evening. (Patient not taking: Reported on 05/10/2023) 30 tablet 1   pravastatin  (PRAVACHOL ) 80 MG tablet TAKE 1 TABLET(80 MG) BY MOUTH DAILY 30 tablet 3   pseudoephedrine  (SUDAFED) 120 MG 12 hr tablet Take 1 tablet (120 mg total) by mouth 2 (two) times daily. (Patient not taking: Reported on 05/10/2023) 20 tablet 0   tirzepatide  (MOUNJARO ) 12.5 MG/0.5ML Pen Inject 12.5 mg into the skin once a week. 6 mL 0   No current facility-administered medications for this visit.     Musculoskeletal: Strength & Muscle Tone:  Unable to assess via virtual visit Gait & Station: normal Patient leans: N/A  Psychiatric Specialty Exam: Review of Systems  Constitutional:        No other complaints voiced  Psychiatric/Behavioral:  Positive for dysphoric mood (Improved). Negative for agitation, hallucinations, self-injury, sleep disturbance and suicidal ideas. The patient is nervous/anxious (Improved). The patient is not hyperactive.   All other systems reviewed and are negative.   There were no vitals taken for this visit.There is no height or weight on file to calculate BMI.  General Appearance: Casual  Eye Contact:  Good  Speech:  Clear and Coherent and Normal Rate  Volume:  Normal  Mood:  Euthymic  Affect:  Congruent  Thought  Process:  Coherent, Goal Directed, and Descriptions of Associations: Intact  Orientation:  Full (Time, Place, and Person)  Thought Content: WDL and Logical   Suicidal Thoughts:  No  Homicidal Thoughts:  No  Memory:  Immediate;   Good Recent;   Good Remote;   Good  Judgement:  Intact  Insight:  Good and Present  Psychomotor Activity:  Normal  Concentration:  Concentration: Good and Attention Span: Good  Recall:  Good  Fund of Knowledge: Good  Language: Good  Akathisia:  No  Handed:  Right  AIMS (if indicated): not done  Assets:  Communication Skills Desire for Improvement Financial Resources/Insurance Housing Leisure Time Physical Health Resilience Social Support Transportation  ADL's:  Intact  Cognition: WNL  Sleep:  Good   Screenings: AIMS    Loss adjuster, chartered Office Visit from 05/10/2023 in [facility] [place]  AIMS Total Score 0      AUDIT    Flowsheet Row Integrated Behavioral Health from 08/06/2022 in [facility]  Alcohol Use Disorder Identification Test Final Score (AUDIT) 3      GAD-7    Flowsheet Row Office Visit from 05/10/2023 in [facility] from 03/19/2023 in [facility] Office Visit from 11/23/2022 in [facility] Office Visit from 10/29/2022 in [facility] Integrated Behavioral Health from 10/23/2022 in [facility]  Total GAD-7 Score 4 6 2 6 4       PHQ2-9    Flowsheet Row Office Visit from 05/10/2023 in [facility] from 03/19/2023 in [facility] Office Visit from 11/23/2022 in [facility] Office Visit from 10/29/2022 in [facility] Integrated Behavioral Health from 10/23/2022 in [facility]  Primary Care  PHQ-2 Total Score 1 3 1 2 2   PHQ-9 Total Score -- 9 4 9 10       Flowsheet Row Office Visit from 05/10/2023 in [facility] at [facility]  from 03/19/2023 in [facility] Integrated Behavioral Health from 10/23/2022 in [facility]  C-SSRS RISK CATEGORY No Risk No Risk No Risk      Assessment and Plan:   Assessment: Patient seen and examined as noted above. Summary: Today Luvenia C Bewley appears to be doing well.  Reports current medication regimen is managing mental health well with no adverse reactions.  States she is eating without difficulty.  States that she is currently taking Prozac  at nigh but feels she would do better taking it during the day since "I can be sleepy and when I take my Prozac  I'm wide awake."  She denies suicidal/self-harm/homicidal ideation, psychosis, paranoia, and fluctuations in mood.    During visit she is dressed appropriate for age and weather.  She is sitting standing leaning over dest at work.  There is no noted distress.  She is alert/oriented x 4, calm/cooperative and mood is congruent with affect.  She spoke in a clear tone at moderate volume, and normal pace, with good eye contact.  Her thought process is coherent, relevant, and there is no indication that she is currently responding to internal/external stimuli or experiencing delusional thought content.    1. GAD (generalized anxiety disorder) - DULoxetine  (CYMBALTA ) 60 MG capsule; Take 1 capsule (60 mg total) by mouth daily.  Dispense: 30 capsule; Refill: 1 - FLUoxetine  (PROZAC ) 10 MG capsule; Take 1 capsule (10 mg total) by mouth daily.  Dispense: 30 capsule; Refill: 1 - hydrOXYzine  (ATARAX ) 10 MG tablet; Take 1 tablet (10 mg total) by mouth 3 (three) times daily as needed. for anxiety  Dispense: 60 tablet; Refill: 1  2. Major depressive disorder, recurrent episode, mild (HCC) (Primary) -  DULoxetine  (CYMBALTA ) 60 MG capsule; Take 1 capsule (60 mg total) by mouth daily.  Dispense: 30 capsule; Refill: 1 - FLUoxetine  (PROZAC ) 10 MG capsule; Take 1 capsule (10 mg total) by mouth daily.  Dispense: 30 capsule; Refill: 1   Plan: Medications: Meds ordered this encounter  Medications   DULoxetine  (CYMBALTA ) 60 MG capsule    Sig: Take 1 capsule (60 mg total) by mouth daily.    Dispense:  30 capsule    Refill:  1    Supervising Provider:   ARFEEN, SYED T [[id number]]   FLUoxetine  (PROZAC ) 10 MG capsule    Sig: Take 1 capsule (10 mg total) by mouth daily.    Dispense:  30 capsule    Refill:  1    Supervising Provider:   ARFEEN, SYED T [[id number]]   hydrOXYzine  (ATARAX ) 10 MG tablet    Sig: Take 1 tablet (10 mg total) by mouth 3 (three) times daily as needed. for anxiety    Dispense:  60 tablet    Refill:  1    Supervising Provider:   Eduard Grad T [[id number]]    Labs:  Not indicated at this time Other:   Continue psychotherapy instructed to call [phone], mobile crisis, or present to the nearest emergency room should she experience any suicidal/homicidal ideation, auditory/visual/hallucinations, or detrimental worsening of her mental health condition.   Patient has participated in the development of this treatment plan and verbalized agreement with plan as listed.  Follow Up: Return in one month for medication management Call in the interim for any side-effects, decompensation, questions, or problems  Collaboration of Care: Collaboration of Care: Medication Management AEB Medication refill  Patient/Guardian was advised Release of Information must be obtained prior to any record release in  order to collaborate their care with an outside provider. Patient/Guardian was advised if they have not already done so to contact the registration department to sign all necessary forms in order for us  to release information regarding their care.   Consent: Patient/Guardian gives verbal consent for  treatment and assignment of benefits for services provided during this visit. Patient/Guardian expressed understanding and agreed to proceed.    Domenique Quest, NP 06/28/2023, 9:21 AM

## 2023-06-30 ENCOUNTER — Encounter: Payer: Self-pay | Admitting: Family Medicine

## 2023-06-30 ENCOUNTER — Ambulatory Visit: Payer: BC Managed Care – PPO | Admitting: Family Medicine

## 2023-06-30 VITALS — BP 137/88 | HR 114 | Ht 64.0 in | Wt 187.1 lb

## 2023-06-30 DIAGNOSIS — J321 Chronic frontal sinusitis: Secondary | ICD-10-CM | POA: Diagnosis not present

## 2023-06-30 DIAGNOSIS — E119 Type 2 diabetes mellitus without complications: Secondary | ICD-10-CM | POA: Diagnosis not present

## 2023-06-30 DIAGNOSIS — J320 Chronic maxillary sinusitis: Secondary | ICD-10-CM

## 2023-06-30 DIAGNOSIS — E1169 Type 2 diabetes mellitus with other specified complication: Secondary | ICD-10-CM

## 2023-06-30 DIAGNOSIS — Z7985 Long-term (current) use of injectable non-insulin antidiabetic drugs: Secondary | ICD-10-CM

## 2023-06-30 DIAGNOSIS — E782 Mixed hyperlipidemia: Secondary | ICD-10-CM | POA: Diagnosis not present

## 2023-06-30 MED ORDER — LEVOCETIRIZINE DIHYDROCHLORIDE 5 MG PO TABS: 5.0000 mg | ORAL_TABLET | Freq: Every evening | ORAL | 3 refills | Status: DC

## 2023-06-30 NOTE — Assessment & Plan Note (Signed)
 Start Xyzal  5 mg daily, continue flonase  PRN Advise regular nasal irrigation with saline can help clear mucus and reduce inflammation. Staying well-hydrated, using a humidifier, and avoiding known allergens or irritants can also provide relief.

## 2023-06-30 NOTE — Patient Instructions (Signed)

## 2023-06-30 NOTE — Progress Notes (Signed)
 Established Patient Office Visit   Subjective  Patient ID: Sabrina Waters, female    DOB: 04-08-1970  Age: 53 y.o. MRN: [id number]  Chief Complaint  Patient presents with   Medical Management of Chronic Issues    Sinus congestion,feel pressure and stopped up, ear pain, headaches      She  has a past medical history of Anxiety, Depression, Elevated cholesterol, Hormone replacement therapy (HRT), LGSIL on Pap smear of cervix (11/20/2019), Prediabetes (11/20/2019), RUQ pain (01/11/2013), and Sleep apnea (09/29/2016).  Sinusitis The patient has a chronic history of sinusitis with gradual worsening of symptoms over time. She reports no fever. Current pain level is rated at 6 out of 10. Associated symptoms include nasal congestion, cough, ear pain, hoarseness, sinus pressure, and sneezing. Notably, she denies chills, neck pain, shortness of breath, or sore throat. She previously used nasal decongestants, which did not provide relief.  Type 2 Diabetes Mellitus The patient is here for a routine follow-up for type 2 diabetes. She denies symptoms of hypoglycemia, including dizziness and tremors. She also denies any diabetic complications such as blurred vision, chest pain, foot ulcers, visual changes, or peripheral neuropathy. She has no history of blackouts. Coronary artery disease risk factors include dyslipidemia and obesity. Current treatment includes weekly Mounjaro  injections. She is fully compliant with her medication and adheres to a diabetic diet. However, she reports infrequent physical activity and does not currently see a podiatrist. Her last eye exam is not up to date.    Review of Systems  Constitutional:  Negative for chills and fever.  HENT:  Positive for congestion and ear pain. Negative for sore throat.   Eyes:  Negative for blurred vision.  Respiratory:  Positive for cough. Negative for shortness of breath.   Cardiovascular:  Negative for chest pain.  Gastrointestinal:  Negative  for abdominal pain.  Genitourinary:  Negative for dysuria.  Musculoskeletal:  Negative for neck pain.  Neurological:  Negative for dizziness and tremors.      Objective:     BP 137/88   Pulse (!) 114   Ht 5\' 4"  (1.626 m)   Wt 187 lb 1.9 oz (84.9 kg)   SpO2 92%   BMI 32.12 kg/m  BP Readings from Last 3 Encounters:  06/30/23 137/88  03/01/23 115/82  11/23/22 (!) 136/90      Physical Exam Vitals reviewed.  Constitutional:      General: She is not in acute distress.    Appearance: Normal appearance. She is not ill-appearing, toxic-appearing or diaphoretic.  HENT:     Head: Normocephalic.     Right Ear: Tympanic membrane normal.     Left Ear: Tympanic membrane normal.     Nose: Nose normal.     Mouth/Throat:     Pharynx: No posterior oropharyngeal erythema.  Eyes:     General:        Right eye: No discharge.        Left eye: No discharge.     Conjunctiva/sclera: Conjunctivae normal.     Pupils: Pupils are equal, round, and reactive to light.  Cardiovascular:     Rate and Rhythm: Normal rate.     Pulses: Normal pulses.     Heart sounds: Normal heart sounds.  Pulmonary:     Effort: Pulmonary effort is normal. No respiratory distress.     Breath sounds: Normal breath sounds.  Abdominal:     General: Bowel sounds are normal.     Palpations: Abdomen is soft.  Tenderness: There is no abdominal tenderness. There is no right CVA tenderness, left CVA tenderness or guarding.  Musculoskeletal:        General: Normal range of motion.     Cervical back: Normal range of motion.  Skin:    General: Skin is warm and dry.     Capillary Refill: Capillary refill takes less than 2 seconds.  Neurological:     Mental Status: She is alert.     Coordination: Coordination normal.     Gait: Gait normal.  Psychiatric:        Mood and Affect: Mood normal.        Behavior: Behavior normal.      No results found for any visits on 06/30/23.  The 10-year ASCVD risk score (Arnett  DK, et al., 2019) is: 12.5%    Assessment & Plan:  Chronic maxillary sinusitis Assessment & Plan: Start Xyzal  5 mg daily, continue flonase  PRN Advise regular nasal irrigation with saline can help clear mucus and reduce inflammation. Staying well-hydrated, using a humidifier, and avoiding known allergens or irritants can also provide relief.   Orders: -     Ambulatory referral to ENT -     Levocetirizine Dihydrochloride ; Take 1 tablet (5 mg total) by mouth every evening.  Dispense: 30 tablet; Refill: 3  Type 2 diabetes mellitus without complication, without long-term current use of insulin (HCC) Assessment & Plan: Last Hemoglobin A1c: 5.7 Labs: Ordered today, results pending; will follow up accordingly. The patient reports adhering to prescribed medications: Mounjaro  weekly injections  Reviewed non-pharmacological interventions, including a balanced diet rich in lean proteins, healthy fats, whole grains, and high-fiber vegetables. Emphasized reducing refined sugars and processed carbohydrates, and incorporating more fruits, leafy greens, and legumes. Education: Patient was educated on recognizing signs and symptoms of both hypoglycemia and hyperglycemia, and advised to seek emergency care if these symptoms occur. Follow-Up: Scheduled for follow-up in 3-4 months, or sooner if needed. Patient Understanding: The patient verbalized understanding of the care plan, and all questions were answered. Additional Care: Ophthalmology referral was placed. Foot exam results were within normal limits.   Orders: -     Hemoglobin A1c  Mixed hyperlipidemia -     BMP8+eGFR -     CBC with Differential/Platelet -     Lipid panel  Chronic frontal sinusitis Assessment & Plan: Start Xyzal  5 mg daily, continue flonase  PRN Advise regular nasal irrigation with saline can help clear mucus and reduce inflammation. Staying well-hydrated, using a humidifier, and avoiding known allergens or irritants can also  provide relief.      Return in about 6 months (around 12/30/2023), or if symptoms worsen or fail to improve, for type 2 diabetes, hyperlipidemia.   Avelino Lek Amber Bail, FNP

## 2023-06-30 NOTE — Assessment & Plan Note (Signed)
 Last Hemoglobin A1c: 5.7 Labs: Ordered today, results pending; will follow up accordingly. The patient reports adhering to prescribed medications: Mounjaro  weekly injections  Reviewed non-pharmacological interventions, including a balanced diet rich in lean proteins, healthy fats, whole grains, and high-fiber vegetables. Emphasized reducing refined sugars and processed carbohydrates, and incorporating more fruits, leafy greens, and legumes. Education: Patient was educated on recognizing signs and symptoms of both hypoglycemia and hyperglycemia, and advised to seek emergency care if these symptoms occur. Follow-Up: Scheduled for follow-up in 3-4 months, or sooner if needed. Patient Understanding: The patient verbalized understanding of the care plan, and all questions were answered. Additional Care: Ophthalmology referral was placed. Foot exam results were within normal limits.

## 2023-07-01 ENCOUNTER — Encounter: Payer: Self-pay | Admitting: Family Medicine

## 2023-07-01 LAB — LIPID PANEL
Chol/HDL Ratio: 5.1 ratio — ABNORMAL HIGH (ref 0.0–4.4)
Cholesterol, Total: 184 mg/dL (ref 100–199)
HDL: 36 mg/dL — ABNORMAL LOW (ref >39)
LDL Chol Calc (NIH): 115 mg/dL — ABNORMAL HIGH (ref 0–99)
Triglycerides: 186 mg/dL — ABNORMAL HIGH (ref 0–149)
VLDL Cholesterol Cal: 33 mg/dL (ref 5–40)

## 2023-07-01 LAB — HEMOGLOBIN A1C
Est. average glucose Bld gHb Est-mCnc: 114 mg/dL
Hgb A1c MFr Bld: 5.6 % (ref 4.8–5.6)

## 2023-07-01 LAB — BMP8+EGFR
BUN/Creatinine Ratio: 8 — ABNORMAL LOW (ref 9–23)
BUN: 7 mg/dL (ref 6–24)
CO2: 21 mmol/L (ref 20–29)
Calcium: 9.4 mg/dL (ref 8.7–10.2)
Chloride: 105 mmol/L (ref 96–106)
Creatinine, Ser: 0.9 mg/dL (ref 0.57–1.00)
Glucose: 88 mg/dL (ref 70–99)
Potassium: 4.8 mmol/L (ref 3.5–5.2)
Sodium: 142 mmol/L (ref 134–144)
eGFR: 77 mL/min/{1.73_m2} (ref >59)

## 2023-07-01 LAB — CBC WITH DIFFERENTIAL/PLATELET
Basophils Absolute: 0 10*3/uL (ref 0.0–0.2)
Basos: 0 %
EOS (ABSOLUTE): 0.1 10*3/uL (ref 0.0–0.4)
Eos: 1 %
Hematocrit: 47 % — ABNORMAL HIGH (ref 34.0–46.6)
Hemoglobin: 15.2 g/dL (ref 11.1–15.9)
Immature Grans (Abs): 0 10*3/uL (ref 0.0–0.1)
Immature Granulocytes: 0 %
Lymphocytes Absolute: 2.5 10*3/uL (ref 0.7–3.1)
Lymphs: 32 %
MCH: 30.3 pg (ref 26.6–33.0)
MCHC: 32.3 g/dL (ref 31.5–35.7)
MCV: 94 fL (ref 79–97)
Monocytes Absolute: 0.4 10*3/uL (ref 0.1–0.9)
Monocytes: 6 %
Neutrophils Absolute: 4.8 10*3/uL (ref 1.4–7.0)
Neutrophils: 61 %
Platelets: 251 10*3/uL (ref 150–450)
RBC: 5.01 x10E6/uL (ref 3.77–5.28)
RDW: 12.1 % (ref 11.7–15.4)
WBC: 7.8 10*3/uL (ref 3.4–10.8)

## 2023-07-07 ENCOUNTER — Other Ambulatory Visit: Payer: Self-pay | Admitting: Family Medicine

## 2023-07-08 ENCOUNTER — Other Ambulatory Visit: Payer: Self-pay | Admitting: Family Medicine

## 2023-07-08 MED ORDER — TIRZEPATIDE 15 MG/0.5ML ~~LOC~~ SOAJ: 15.0000 mg | SUBCUTANEOUS | 0 refills | Status: DC

## 2023-07-22 ENCOUNTER — Other Ambulatory Visit: Payer: Self-pay | Admitting: Adult Health

## 2023-09-16 ENCOUNTER — Other Ambulatory Visit (HOSPITAL_COMMUNITY): Payer: Self-pay

## 2023-09-20 ENCOUNTER — Encounter: Payer: Self-pay | Admitting: Family Medicine

## 2023-09-23 ENCOUNTER — Other Ambulatory Visit: Payer: Self-pay | Admitting: Family Medicine

## 2023-09-23 ENCOUNTER — Telehealth (INDEPENDENT_AMBULATORY_CARE_PROVIDER_SITE_OTHER): Admitting: Registered Nurse

## 2023-09-23 ENCOUNTER — Encounter (HOSPITAL_COMMUNITY): Payer: Self-pay | Admitting: Registered Nurse

## 2023-09-23 DIAGNOSIS — F411 Generalized anxiety disorder: Secondary | ICD-10-CM

## 2023-09-23 DIAGNOSIS — F330 Major depressive disorder, recurrent, mild: Secondary | ICD-10-CM | POA: Diagnosis not present

## 2023-09-23 MED ORDER — HYDROXYZINE HCL 10 MG PO TABS: 10.0000 mg | ORAL_TABLET | Freq: Three times a day (TID) | ORAL | 1 refills | Status: DC | PRN

## 2023-09-23 MED ORDER — DULOXETINE HCL 60 MG PO CPEP: 60.0000 mg | ORAL_CAPSULE | Freq: Every day | ORAL | 1 refills | Status: DC

## 2023-09-23 MED ORDER — TIRZEPATIDE 12.5 MG/0.5ML ~~LOC~~ SOAJ
12.5000 mg | SUBCUTANEOUS | 2 refills | Status: DC
Start: 2023-09-23 — End: 2023-12-31

## 2023-09-23 MED ORDER — FLUOXETINE HCL 20 MG PO CAPS: 20.0000 mg | ORAL_CAPSULE | Freq: Every day | ORAL | 1 refills | Status: DC

## 2023-09-23 NOTE — Progress Notes (Signed)
 [facility] MD/PA/NP OP Progress Note  09/23/2023 5:28 PM Sabrina Waters  MRN:  [id number]  Virtual Visit via Video Note  I connected with Sabrina Waters on 09/23/23 at 11:00 AM EDT by a video enabled telemedicine application and verified that I am speaking with the correct person using two identifiers.  Location: Patient: Work Provider: [facility] [place]   I discussed the limitations of evaluation and management by telemedicine and the availability of in person appointments. The patient expressed understanding and agreed to proceed.   I discussed the assessment and treatment plan with the patient. The patient was provided an opportunity to ask questions and all were answered. The patient agreed with the plan and demonstrated an understanding of the instructions.   The patient was advised to call back or seek an in-person evaluation if the symptoms worsen or if the condition fails to improve as anticipated.  I provided 30 minutes of non-face-to-face time during this encounter.   Sabrina Ruder, NP   Chief Complaint:  Chief Complaint  Patient presents with   Follow-up    Medication management   HPI:  Sabrina Waters 53 y.o. female presents today for medication management follow up.  She is seen via virtual video visit by this provider, and chart reviewed on 09/23/23.  Her psychiatric history is significant for general anxiety and major depression.  Her mental health is currently managed with Cymbalta  60 mg daily, Prozac  10 mg daily, and vistaril  10 mg Tid prn.  She reports she continues to be under a lot of stress at work There are still a lot of changes going on at work and it is really stressful.  I've had more added to my work load with no increase in pay.  But everybody is stressing.  Reports worsening in depressive symptoms and anxiety.  States that she doesn't feel that medications are currently managing her mental health.  She denies suicidal/self-harm/homicidal ideation,  psychosis, paranoia, and abnormal movements.  PHQ 2/9, C-SSRS, GAD 7, AUDIT, AIMS, nutrition, and pain screenings conducted during today's visit, see scores below.   Treatment options discussed:  Increasing Prozac  20 mg, also consider starting Buspar  if no improvement by next scheduled visit since took in past and worked.  Doesn't want to increase Vistaril  related to the 10 mg makes her sleepy and helps when taken as needed for anxiety.  Declines counseling/therapy at this time.      Recommended the following:  Increase Prozac  to 20 mg daily, continue Cymbalta  60 mg daily, and Vistaril  10 mg Tid prn.  She voices understanding/ agreement with information/recommendations being given to her today.     Visit Diagnosis:    ICD-10-CM   1. Major depressive disorder, recurrent episode, mild (HCC)  F33.0 FLUoxetine  (PROZAC ) 20 MG capsule    DULoxetine  (CYMBALTA ) 60 MG capsule    2. GAD (generalized anxiety disorder)  F41.1 FLUoxetine  (PROZAC ) 20 MG capsule    DULoxetine  (CYMBALTA ) 60 MG capsule    hydrOXYzine  (ATARAX ) 10 MG tablet      Past Psychiatric History: GAD, MDD  Past Medical History:  Past Medical History:  Diagnosis Date   Anxiety    Depression    Elevated cholesterol    Hormone replacement therapy (HRT)    LGSIL on Pap smear of cervix 11/20/2019   HPV negative, repeat in 3 years per [facility], CIN 3+risk is 0.25 %   Prediabetes 11/20/2019   RUQ pain 01/11/2013   Sleep apnea 09/29/2016   Had mild  to moderate sleep apnea on home study needs CPAP titration study per Dr Milton     Past Surgical History:  Procedure Laterality Date   ENDOMETRIAL ABLATION      Family Psychiatric History: See below in family history  Family History:  Family History  Problem Relation Age of Onset   Hypertension Mother    Dementia Mother    Bipolar disorder Sister    Thyroid  cancer Sister    Hypertension Brother    Cancer Brother    Depression Niece    Anxiety disorder Niece     Social  History:  Social History   Socioeconomic History   Marital status: Divorced    Spouse name: Not on file   Number of children: 1   Years of education: Not on file   Highest education level: GED or equivalent  Occupational History   Not on file  Tobacco Use   Smoking status: Every Day    Current packs/day: 1.00    Average packs/day: 0.8 packs/day for 60.6 years (45.6 ttl pk-yrs)    Types: Cigarettes    Start date: 1995   Smokeless tobacco: Never  Vaping Use   Vaping status: Never Used  Substance and Sexual Activity   Alcohol use: Not Currently    Comment: socially   Drug use: No   Sexual activity: Not Currently    Birth control/protection: Surgical    Comment: ablation  Other Topics Concern   Not on file  Social History Narrative   Not on file   Social Drivers of Health   Financial Resource Strain: Low Risk  (03/01/2023)   Overall Financial Resource Strain (CARDIA)    Difficulty of Paying Living Expenses: Not very hard  Food Insecurity: No Food Insecurity (03/01/2023)   Hunger Vital Sign    Worried About Running Out of Food in the Last Year: Never true    Ran Out of Food in the Last Year: Never true  Transportation Needs: No Transportation Needs (03/01/2023)   PRAPARE - Administrator, Civil Service (Medical): No    Lack of Transportation (Non-Medical): No  Physical Activity: Insufficiently Active (03/01/2023)   Exercise Vital Sign    Days of Exercise per Week: 1 day    Minutes of Exercise per Session: 10 min  Stress: No Stress Concern Present (03/01/2023)   [employer] of Occupational Health - Occupational Stress Questionnaire    Feeling of Stress : Only a little  Social Connections: Moderately Isolated (03/01/2023)   Social Connection and Isolation Panel    Frequency of Communication with Friends and Family: Three times a week    Frequency of Social Gatherings with Friends and Family: Twice a week    Attends Religious Services: 1 to 4 times per  year    Active Member of [employer] or Organizations: No    Attends Engineer, structural: Not on file    Marital Status: Divorced    Allergies:  Allergies  Allergen Reactions   Sulfa Antibiotics Nausea And Vomiting    Metabolic Disorder Labs: Lab Results  Component Value Date   HGBA1C 5.6 06/30/2023   No results found for: PROLACTIN Lab Results  Component Value Date   CHOL 184 06/30/2023   TRIG 186 (H) 06/30/2023   HDL 36 (L) 06/30/2023   CHOLHDL 5.1 (H) 06/30/2023   VLDL 46 (H) 01/11/2013   LDLCALC 115 (H) 06/30/2023   LDLCALC 112 (H) 12/16/2022   Lab Results  Component Value Date  TSH 1.440 07/03/2022   TSH 1.310 11/15/2019    Current Medications: Current Outpatient Medications  Medication Sig Dispense Refill   DULoxetine  (CYMBALTA ) 60 MG capsule Take 1 capsule (60 mg total) by mouth daily. 30 capsule 1   FLUoxetine  (PROZAC ) 20 MG capsule Take 1 capsule (20 mg total) by mouth daily. 30 capsule 1   fluticasone  (FLONASE ) 50 MCG/ACT nasal spray Place 2 sprays into both nostrils daily. 16 g 1   hydrOXYzine  (ATARAX ) 10 MG tablet Take 1 tablet (10 mg total) by mouth 3 (three) times daily as needed. for anxiety 60 tablet 1   levocetirizine (XYZAL ) 5 MG tablet Take 1 tablet (5 mg total) by mouth every evening. 30 tablet 3   pravastatin  (PRAVACHOL ) 80 MG tablet TAKE 1 TABLET(80 MG) BY MOUTH DAILY 30 tablet 3   tirzepatide  (MOUNJARO ) 12.5 MG/0.5ML Pen Inject 12.5 mg into the skin once a week. 2 mL 2   No current facility-administered medications for this visit.     Musculoskeletal: Strength & Muscle Tone: Unable to assess via virtual visit Gait & Station: normal Patient leans: N/A  Psychiatric Specialty Exam: Review of Systems  Constitutional:        No other complaints voiced  Psychiatric/Behavioral:  Positive for dysphoric mood. Negative for agitation, hallucinations, self-injury, sleep disturbance and suicidal ideas. The patient is nervous/anxious. The  patient is not hyperactive.   All other systems reviewed and are negative.   There were no vitals taken for this visit.There is no height or weight on file to calculate BMI.  General Appearance: Casual  Eye Contact:  Good  Speech:  Clear and Coherent and Normal Rate  Volume:  Normal  Mood:  Euthymic  Affect:  Congruent  Thought Process:  Coherent, Goal Directed, and Descriptions of Associations: Intact  Orientation:  Full (Time, Place, and Person)  Thought Content: WDL and Logical   Suicidal Thoughts:  No  Homicidal Thoughts:  No  Memory:  Immediate;   Good Recent;   Good Remote;   Good  Judgement:  Intact  Insight:  Good and Present  Psychomotor Activity:  Normal  Concentration:  Concentration: Good and Attention Span: Good  Recall:  Good  Fund of Knowledge: Good  Language: Good  Akathisia:  No  Handed:  Right  AIMS (if indicated): not done  Assets:  Communication Skills Desire for Improvement Financial Resources/Insurance Housing Leisure Time Physical Health Resilience Social Support Transportation  ADL's:  Intact  Cognition: WNL  Sleep:  Good   Screenings: AIMS    Flowsheet Row Video Visit from 09/23/2023 in [facility] at [place] Office Visit from 05/10/2023 in [facility] at [place]  AIMS Total Score 0 0   AUDIT    Flowsheet Row Integrated Behavioral Health from 08/06/2022 in [facility]  Alcohol Use Disorder Identification Test Final Score (AUDIT) 3   GAD-7    Flowsheet Row Video Visit from 09/23/2023 in [facility] at [place] Office Visit from 06/30/2023 in [facility] Office Visit from 05/10/2023 in [facility] at [facility] from 03/19/2023 in [facility] Office Visit from 11/23/2022 in [facility] Primary [facility]  Total GAD-7 Score  6 1 4 6 2    PHQ2-9    Flowsheet Row Video Visit from 09/23/2023 in [facility] at [place] Office Visit from 06/30/2023 in [facility] Office  Visit from 05/10/2023 in [facility] at [facility] from 03/19/2023 in [facility] Office Visit from 11/23/2022 in [facility]  PHQ-2 Total Score 2 2 1 3 1   PHQ-9 Total Score 7 4 -- 9 4   Flowsheet Row Video Visit from 09/23/2023 in [facility] at [facility] Visit from 05/10/2023 in [facility] at [facility] from 03/19/2023 in [facility] Care  C-SSRS RISK CATEGORY No Risk No Risk No Risk   Assessment and Plan:   Assessment: Patient seen and examined as noted above. Summary: Today Sabrina Waters appears to be doing well.  Reports increased stress at work which has caused worsening in depression and anxiety.  She denies suicidal/self-harm/homicidal ideation, psychosis, paranoia, and fluctuations in mood.    During visit she is dressed appropriate for age and weather.  She is sitting standing leaning over dest at work.  There is no noted distress.  She is alert/oriented x 4, calm/cooperative and mood is congruent with affect.  She spoke in a clear tone at moderate volume, and normal pace, with good eye contact.  Her thought process is coherent, relevant, and there is no indication that she is currently responding to internal/external stimuli or experiencing delusional thought content.    1. GAD (generalized anxiety disorder) - FLUoxetine  (PROZAC ) 20 MG capsule; Take 1 capsule (20 mg total) by mouth daily.  Dispense: 30 capsule; Refill: 1 - DULoxetine  (CYMBALTA ) 60 MG capsule; Take 1 capsule (60 mg total) by mouth daily.  Dispense: 30 capsule; Refill: 1 - hydrOXYzine  (ATARAX ) 10 MG tablet; Take 1  tablet (10 mg total) by mouth 3 (three) times daily as needed. for anxiety  Dispense: 60 tablet; Refill: 1  2. Major depressive disorder, recurrent episode, mild (HCC) (Primary) - FLUoxetine  (PROZAC ) 20 MG capsule; Take 1 capsule (20 mg total) by mouth daily.  Dispense: 30 capsule; Refill: 1 - DULoxetine  (CYMBALTA ) 60 MG capsule; Take 1 capsule (60 mg total) by mouth daily.  Dispense: 30 capsule; Refill: 1   Plan: Medications: Meds ordered this encounter  Medications   FLUoxetine  (PROZAC ) 20 MG capsule    Sig: Take 1 capsule (20 mg total) by mouth daily.    Dispense:  30 capsule    Refill:  1    Supervising Provider:   ARFEEN, SYED T [[id number]]   DULoxetine  (CYMBALTA ) 60 MG capsule    Sig: Take 1 capsule (60 mg total) by mouth daily.    Dispense:  30 capsule    Refill:  1    Supervising Provider:   CURRY, SYED T [[id number]]   hydrOXYzine  (ATARAX ) 10 MG tablet    Sig: Take 1 tablet (10 mg total) by mouth 3 (three) times daily as needed. for anxiety    Dispense:  60 tablet    Refill:  1    Supervising Provider:   CURRY PATERSON T [[id number]]    Labs:  Not indicated at this time Other:   Declines counseling/therapy at this time instructed to call [phone], [phone], mobile crisis, or present to the nearest emergency room should she experience any suicidal/homicidal ideation, auditory/visual/hallucinations, or detrimental worsening of her mental health condition.   Patient has participated in the development of this treatment plan and verbalized agreement with plan as listed.  Follow Up: Return in one month for medication management Call in the interim for any side-effects, decompensation, questions, or problems  Collaboration of Care: Collaboration of Care: Medication Management AEB medication assessment, adjustment, and refill  Patient/Guardian was advised Release of Information must be obtained prior to any record release in order to collaborate their care with an outside provider. Patient/Guardian was  advised if they have not already done so to contact the registration department to sign all necessary forms in order for us  to release information regarding their care.   Consent: Patient/Guardian gives verbal consent for treatment and assignment of benefits for services provided during this visit. Patient/Guardian expressed understanding and agreed to proceed.    Coralie Stanke, NP 09/23/2023, 5:28 PM

## 2023-09-23 NOTE — Patient Instructions (Signed)

## 2023-11-17 ENCOUNTER — Other Ambulatory Visit: Payer: Self-pay | Admitting: Adult Health

## 2023-11-19 ENCOUNTER — Other Ambulatory Visit: Payer: Self-pay | Admitting: Family Medicine

## 2023-11-19 DIAGNOSIS — J320 Chronic maxillary sinusitis: Secondary | ICD-10-CM

## 2023-11-23 ENCOUNTER — Telehealth (HOSPITAL_COMMUNITY): Payer: Self-pay

## 2023-11-23 ENCOUNTER — Other Ambulatory Visit (HOSPITAL_COMMUNITY): Payer: Self-pay

## 2023-11-23 DIAGNOSIS — F411 Generalized anxiety disorder: Secondary | ICD-10-CM

## 2023-11-23 DIAGNOSIS — F330 Major depressive disorder, recurrent, mild: Secondary | ICD-10-CM

## 2023-11-23 MED ORDER — FLUOXETINE HCL 20 MG PO CAPS: 20.0000 mg | ORAL_CAPSULE | Freq: Every day | ORAL | 1 refills | Status: DC

## 2023-11-23 NOTE — Telephone Encounter (Signed)
 Pt called in for a refill on her Fluoxetine  20 MG Capsule sent to [facility] on [address]. Pt is scheduled with Shuvon on 12/27/23. Please advise.

## 2023-11-23 NOTE — Telephone Encounter (Signed)
 Pt aware rx sent.

## 2023-12-21 ENCOUNTER — Ambulatory Visit: Admitting: Adult Health

## 2023-12-21 ENCOUNTER — Encounter: Payer: Self-pay | Admitting: Adult Health

## 2023-12-21 VITALS — BP 136/96 | HR 120 | Ht 64.0 in | Wt 189.5 lb

## 2023-12-21 DIAGNOSIS — Z1212 Encounter for screening for malignant neoplasm of rectum: Secondary | ICD-10-CM | POA: Diagnosis not present

## 2023-12-21 DIAGNOSIS — F419 Anxiety disorder, unspecified: Secondary | ICD-10-CM | POA: Diagnosis not present

## 2023-12-21 DIAGNOSIS — Z01419 Encounter for gynecological examination (general) (routine) without abnormal findings: Secondary | ICD-10-CM | POA: Diagnosis not present

## 2023-12-21 DIAGNOSIS — F32A Depression, unspecified: Secondary | ICD-10-CM

## 2023-12-21 DIAGNOSIS — Z1211 Encounter for screening for malignant neoplasm of colon: Secondary | ICD-10-CM | POA: Diagnosis not present

## 2023-12-21 DIAGNOSIS — Z1231 Encounter for screening mammogram for malignant neoplasm of breast: Secondary | ICD-10-CM

## 2023-12-21 DIAGNOSIS — R030 Elevated blood-pressure reading, without diagnosis of hypertension: Secondary | ICD-10-CM

## 2023-12-21 NOTE — Progress Notes (Signed)
 Patient ID: Sabrina Waters, female   DOB: 10/25/1970, 53 y.o.   MRN: [id number] History of Present Illness: Sabrina Waters is a 53 year old white female, divorced, G1P1001 in for a well woman gyn exam.     Component Value Date/Time   DIAGPAP  07/03/2022 0953    - Negative for intraepithelial lesion or malignancy (NILM)   DIAGPAP - Low grade squamous intraepithelial lesion (LSIL) (A) 11/15/2019 0947   DIAGPAP  01/09/2016 0000    NEGATIVE FOR INTRAEPITHELIAL LESIONS OR MALIGNANCY.   HPVHIGH Negative 07/03/2022 0953   HPVHIGH Negative 11/15/2019 0947   ADEQPAP  07/03/2022 0953    Satisfactory for evaluation; transformation zone component ABSENT.   ADEQPAP  11/15/2019 0947    Satisfactory for evaluation; transformation zone component PRESENT.   ADEQPAP  01/09/2016 0000    Satisfactory for evaluation  endocervical/transformation zone component PRESENT.    PCP is I Polanco NP  Current Medications, Allergies, Past Medical History, Past Surgical History, Family History and Social History were reviewed in [employer] record.     Review of Systems: Patient denies any hearing loss, fatigue, blurred vision, shortness of breath, chest pain, abdominal pain, problems with bowel movements, urination, or intercourse(not currently active). No  mood swings.  Has headaches about every other day Has joint pains has lost about 28 lbs in last 6 months on mounjaro  Denies any vaginal bleeding, had ablation    Physical Exam:BP (!) 136/96 (BP Location: Right Arm, Patient Position: Sitting, Cuff Size: Normal)   Pulse (!) 120   Ht 5' 4 (1.626 m)   Wt 189 lb 8 oz (86 kg)   BMI 32.53 kg/m   General:  Well developed, well nourished, no acute distress Skin:  Warm and dry, has numerous moles and AKs Neck:  Midline trachea, normal thyroid , good ROM, no lymphadenopathy Lungs; Clear to auscultation bilaterally Breast:  No dominant palpable mass, retraction, or nipple discharge Cardiovascular:  Regular rate and rhythm Abdomen:  Soft, non tender, no hepatosplenomegaly Pelvic:  External genitalia is normal in appearance, no lesions.  The vagina is normal in appearance. Urethra has no lesions or masses. The cervix is smooth.   Uterus is felt to be normal size, shape, and contour.  No adnexal masses or tenderness noted.Bladder is non tender, no masses felt. Rectal:Deferred  Extremities/musculoskeletal:  No swelling or varicosities noted, no clubbing or cyanosis Psych:  No mood changes, alert and cooperative,seems happy AA is 1 Fall risk is low    12/21/2023    9:53 AM 09/23/2023   11:08 AM 06/30/2023    8:19 AM  Depression screen PHQ 2/9  Decreased Interest 1  1  Down, Depressed, Hopeless 1  1  PHQ - 2 Score 2  2  Altered sleeping 1  1  Tired, decreased energy 1  1  Change in appetite 1  0  Feeling bad or failure about yourself  0  0  Trouble concentrating 0  0  Moving slowly or fidgety/restless 0  0  Suicidal thoughts 0  0  PHQ-9 Score 5  4  Difficult doing work/chores        Information is confidential and restricted. Go to Review Flowsheets to unlock data.       12/21/2023    9:53 AM 09/23/2023   11:11 AM 06/30/2023    8:19 AM 05/10/2023    9:57 AM  GAD 7 : Generalized Anxiety Score  Nervous, Anxious, on Edge 1  0   Control/stop worrying  0  0   Worry too much - different things 0  0   Trouble relaxing 0  0   Restless 0  0   Easily annoyed or irritable 1  1   Afraid - awful might happen 0  0   Total GAD 7 Score 2  1   Anxiety Difficulty   Not difficult at all      Information is confidential and restricted. Go to Review Flowsheets to unlock data.      Upstream - 12/21/23 0951       Pregnancy Intention Screening   Does the patient want to become pregnant in the next year? N/A    Does the patient's partner want to become pregnant in the next year? N/A    Would the patient like to discuss contraceptive options today? N/A      Contraception Wrap Up   Current  Method No Method - Other Reason   ablation   End Method No Method - Other Reason   ablation   Contraception Counseling Provided No         Examination chaperoned by Clarita Salt LPN   Impression and plan: 1. Encounter for well woman exam with routine gynecological exam (Primary) Pap in 2027 Physical in 1 year Labs with PCP   2. Elevated BP without diagnosis of hypertension Follow up with PCP this week for recheck, has appt Friday Hypertension does run in her family  Decrease salt and sweets and try to walk 30 minutes 5/7 days   3. Screening mammogram for breast cancer Mammogram scheduled for her 12/29/23 at 11:15 am at [facility]  - MM 3D SCREENING MAMMOGRAM BILATERAL BREAST; Future  4. Anxiety and depression She is on meds and sees therapist   5. Screening for colorectal cancer Ordered cologuard - Cologuard

## 2023-12-23 ENCOUNTER — Encounter (HOSPITAL_COMMUNITY): Payer: Self-pay | Admitting: Registered Nurse

## 2023-12-23 ENCOUNTER — Other Ambulatory Visit (HOSPITAL_COMMUNITY): Payer: Self-pay | Admitting: Registered Nurse

## 2023-12-23 ENCOUNTER — Telehealth (HOSPITAL_COMMUNITY): Payer: Self-pay | Admitting: *Deleted

## 2023-12-23 DIAGNOSIS — F330 Major depressive disorder, recurrent, mild: Secondary | ICD-10-CM

## 2023-12-23 DIAGNOSIS — F411 Generalized anxiety disorder: Secondary | ICD-10-CM

## 2023-12-23 MED ORDER — DULOXETINE HCL 60 MG PO CPEP
60.0000 mg | ORAL_CAPSULE | Freq: Every day | ORAL | 1 refills | Status: DC
Start: 2023-12-23 — End: 2023-12-27

## 2023-12-23 NOTE — Telephone Encounter (Addendum)
 Patient pharmacy requesting refill for patient Duloxetine  60mg . Medication was last filled on 09-23-23 with 1 refill. Patient f/u appt is 12/27/2023.

## 2023-12-24 ENCOUNTER — Ambulatory Visit

## 2023-12-27 ENCOUNTER — Encounter (HOSPITAL_COMMUNITY): Payer: Self-pay | Admitting: Registered Nurse

## 2023-12-27 ENCOUNTER — Telehealth (HOSPITAL_COMMUNITY): Admitting: Registered Nurse

## 2023-12-27 DIAGNOSIS — F330 Major depressive disorder, recurrent, mild: Secondary | ICD-10-CM | POA: Diagnosis not present

## 2023-12-27 DIAGNOSIS — F411 Generalized anxiety disorder: Secondary | ICD-10-CM | POA: Diagnosis not present

## 2023-12-27 MED ORDER — DULOXETINE HCL 60 MG PO CPEP: 60.0000 mg | ORAL_CAPSULE | Freq: Every day | ORAL | 1 refills | Status: AC

## 2023-12-27 MED ORDER — FLUOXETINE HCL 20 MG PO CAPS: 20.0000 mg | ORAL_CAPSULE | Freq: Every day | ORAL | 1 refills | Status: AC

## 2023-12-27 MED ORDER — HYDROXYZINE HCL 10 MG PO TABS
10.0000 mg | ORAL_TABLET | Freq: Three times a day (TID) | ORAL | 1 refills | Status: AC | PRN
Start: 2023-12-27 — End: ?

## 2023-12-27 MED ORDER — FLUOXETINE HCL 10 MG PO CAPS: 10.0000 mg | ORAL_CAPSULE | Freq: Every day | ORAL | 1 refills | Status: AC

## 2023-12-27 NOTE — Patient Instructions (Signed)

## 2023-12-27 NOTE — Progress Notes (Signed)
 [facility] MD/PA/NP OP Progress Note  12/27/2023 2:48 PM Sabrina Waters  MRN:  [id number]  Virtual Visit via Video Note  I connected with Sabrina Waters on 12/27/23 at  2:30 PM EDT by a video enabled telemedicine application and verified that I am speaking with the correct person using two identifiers.  Location: Patient: Work Provider: [facility], [place]   I discussed the limitations of evaluation and management by telemedicine and the availability of in person appointments. The patient expressed understanding and agreed to proceed.   I discussed the assessment and treatment plan with the patient. The patient was provided an opportunity to ask questions and all were answered. The patient agreed with the plan and demonstrated an understanding of the instructions.   The patient was advised to call back or seek an in-person evaluation if the symptoms worsen or if the condition fails to improve as anticipated.  I provided 30 minutes of non-face-to-face time during this encounter.   Luisa Ruder, NP   Chief Complaint:  Chief Complaint  Patient presents with   Follow-up    Medication management   HPI:  Sabrina Waters 53 y.o. female presents today for medication management follow up.  She is seen via virtual video visit by this provider, and chart reviewed on 12/27/23.  Her psychiatric history is significant for general anxiety and major depression.  Her mental health is currently managed with Cymbalta  60 mg daily, Prozac  20 mg daily, and vistaril  10 mg Tid prn.  She reports current medication regimen continues to manage her mental health effectively but at this point she feels that she could be a little better.  Reports at initial increase of Prozac  she felt a lot better and feels that there has been no more improvement but she has not worsened either.  She states she feels that the increase in her Prozac  would help.  Reports she is eating without difficulty.  She states she has been  taking Mounjaro  to help with weight loss.  She reports she is sleeping without difficulty but on nights she needs something to help with sleep she takes melatonin 3 mg.  She reports stressors continue at work but she has been handling it pretty well.  She denies suicidal/self-harm/homicidal ideation, psychosis, paranoia, and abnormal movements.  Screenings completed during today's visit AIMS, Nutrition, and Pain.  PHQ-9 and GAD-7 done 12/21/23 at well visit.  See scores below.  Recommendations: Increase Prozac  30 mg, continue Cymbalta  60 mg daily, and Vistaril  10 mg 3 times daily as needed.  She voices understanding/ agreement with information/recommendations being given to her today.     Visit Diagnosis:    ICD-10-CM   1. Major depressive disorder, recurrent episode, mild  F33.0 FLUoxetine  (PROZAC ) 20 MG capsule    FLUoxetine  (PROZAC ) 10 MG capsule    DULoxetine  (CYMBALTA ) 60 MG capsule    2. GAD (generalized anxiety disorder)  F41.1 hydrOXYzine  (ATARAX ) 10 MG tablet    FLUoxetine  (PROZAC ) 20 MG capsule    FLUoxetine  (PROZAC ) 10 MG capsule    DULoxetine  (CYMBALTA ) 60 MG capsule       Past Psychiatric History: GAD, MDD  Past Medical History:  Past Medical History:  Diagnosis Date   Anxiety    Depression    Elevated cholesterol    Hormone replacement therapy (HRT)    LGSIL on Pap smear of cervix 11/20/2019   HPV negative, repeat in 3 years per [facility], CIN 3+risk is 0.25 %   Prediabetes 11/20/2019  RUQ pain 01/11/2013   Sleep apnea 09/29/2016   Had mild to moderate sleep apnea on home study needs CPAP titration study per Dr Milton     Past Surgical History:  Procedure Laterality Date   ENDOMETRIAL ABLATION      Family Psychiatric History: See below in family history  Family History:  Family History  Problem Relation Age of Onset   Hypertension Mother    Dementia Mother    Bipolar disorder Sister    Thyroid  cancer Sister    Hypertension Brother    Cancer Brother     Depression Niece    Anxiety disorder Niece     Social History:  Social History   Socioeconomic History   Marital status: Divorced    Spouse name: Not on file   Number of children: 1   Years of education: Not on file   Highest education level: GED or equivalent  Occupational History   Not on file  Tobacco Use   Smoking status: Every Day    Current packs/day: 1.00    Average packs/day: 0.8 packs/day for 60.8 years (45.8 ttl pk-yrs)    Types: Cigarettes    Start date: 1995   Smokeless tobacco: Never  Vaping Use   Vaping status: Never Used  Substance and Sexual Activity   Alcohol use: Yes    Comment: socially   Drug use: No   Sexual activity: Not Currently    Birth control/protection: Surgical    Comment: ablation  Other Topics Concern   Not on file  Social History Narrative   Not on file   Social Drivers of Health   Financial Resource Strain: Low Risk  (12/21/2023)   Overall Financial Resource Strain (CARDIA)    Difficulty of Paying Living Expenses: Not hard at all  Food Insecurity: No Food Insecurity (12/21/2023)   Hunger Vital Sign    Worried About Running Out of Food in the Last Year: Never true    Ran Out of Food in the Last Year: Never true  Transportation Needs: No Transportation Needs (12/21/2023)   PRAPARE - Administrator, Civil Service (Medical): No    Lack of Transportation (Non-Medical): No  Physical Activity: Insufficiently Active (12/21/2023)   Exercise Vital Sign    Days of Exercise per Week: 1 day    Minutes of Exercise per Session: 10 min  Stress: Stress Concern Present (12/21/2023)   [employer] of Occupational Health - Occupational Stress Questionnaire    Feeling of Stress: To some extent  Social Connections: Moderately Isolated (12/21/2023)   Social Connection and Isolation Panel    Frequency of Communication with Friends and Family: More than three times a week    Frequency of Social Gatherings with Friends and Family:  Twice a week    Attends Religious Services: 1 to 4 times per year    Active Member of [employer] or Organizations: No    Attends Banker Meetings: Never    Marital Status: Divorced    Allergies:  Allergies  Allergen Reactions   Sulfa Antibiotics Nausea And Vomiting    Metabolic Disorder Labs: Lab Results  Component Value Date   HGBA1C 5.6 06/30/2023   No results found for: PROLACTIN Lab Results  Component Value Date   CHOL 184 06/30/2023   TRIG 186 (H) 06/30/2023   HDL 36 (L) 06/30/2023   CHOLHDL 5.1 (H) 06/30/2023   VLDL 46 (H) 01/11/2013   LDLCALC 115 (H) 06/30/2023   LDLCALC 112 (H) 12/16/2022  Lab Results  Component Value Date   TSH 1.440 07/03/2022   TSH 1.310 11/15/2019    Current Medications: Current Outpatient Medications  Medication Sig Dispense Refill   FLUoxetine  (PROZAC ) 10 MG capsule Take 1 capsule (10 mg total) by mouth daily. Take with Prozac  20 mg to total 30 mg daily 90 capsule 1   Acetaminophen (TYLENOL PO) Take by mouth.     DULoxetine  (CYMBALTA ) 60 MG capsule Take 1 capsule (60 mg total) by mouth daily. 90 capsule 1   FLUoxetine  (PROZAC ) 20 MG capsule Take 1 capsule (20 mg total) by mouth daily. Take with Prozac  10 mg to total 30 mg daily 90 capsule 1   fluticasone  (FLONASE ) 50 MCG/ACT nasal spray Place 2 sprays into both nostrils daily. 16 g 1   hydrOXYzine  (ATARAX ) 10 MG tablet Take 1 tablet (10 mg total) by mouth 3 (three) times daily as needed. for anxiety 90 tablet 1   ibuprofen  (ADVIL ) 200 MG tablet Take 400 mg by mouth every 6 (six) hours as needed.     levocetirizine (XYZAL ) 5 MG tablet TAKE 1 TABLET(5 MG) BY MOUTH EVERY EVENING 30 tablet 3   pravastatin  (PRAVACHOL ) 80 MG tablet TAKE 1 TABLET(80 MG) BY MOUTH DAILY 30 tablet 3   tirzepatide  (MOUNJARO ) 12.5 MG/0.5ML Pen Inject 12.5 mg into the skin once a week. 2 mL 2   No current facility-administered medications for this visit.     Musculoskeletal: Strength & Muscle Tone:  Unable to assess via virtual visit Gait & Station: normal Patient leans: N/A  Psychiatric Specialty Exam: Review of Systems  Constitutional:        No other complaints voiced  Psychiatric/Behavioral:  Positive for dysphoric mood (Reporting no better no worse). Negative for agitation, hallucinations, self-injury, sleep disturbance and suicidal ideas. The patient is nervous/anxious (Reporting no better no worse). The patient is not hyperactive.   All other systems reviewed and are negative.   There were no vitals taken for this visit.There is no height or weight on file to calculate BMI.  General Appearance: Casual  Eye Contact:  Good  Speech:  Clear and Coherent and Normal Rate  Volume:  Normal  Mood:  Euthymic  Affect:  Congruent  Thought Process:  Coherent, Goal Directed, and Descriptions of Associations: Intact  Orientation:  Full (Time, Place, and Person)  Thought Content: Logical   Suicidal Thoughts:  No  Homicidal Thoughts:  No  Memory:  Immediate;   Good Recent;   Good Remote;   Good  Judgement:  Intact  Insight:  Good and Present  Psychomotor Activity:  Normal  Concentration:  Concentration: Good and Attention Span: Good  Recall:  Good  Fund of Knowledge: Good  Language: Good  Akathisia:  No  Handed:  Right  AIMS (if indicated): not done  Assets:  Communication Skills Desire for Improvement Financial Resources/Insurance Housing Leisure Time Physical Health Resilience Social Support Transportation  ADL's:  Intact  Cognition: WNL  Sleep:  Good   Screenings: AIMS    Flowsheet Row Video Visit from 12/27/2023 in [facility] [place] Video Visit from 09/23/2023 in [facility] [place] Office Visit from 05/10/2023 in [facility] [place]  AIMS Total Score 0 0 0   AUDIT    Flowsheet Row Integrated Behavioral Health from 08/06/2022 in [facility]  Alcohol Use Disorder Identification Test Final Score (AUDIT) 3   GAD-7    Flowsheet  Row Office Visit from 12/21/2023 in [facility] for [facility] at [facility] Video Visit from 09/23/2023 in [facility] at [place] Office Visit from 06/30/2023 in [facility] Office Visit from 05/10/2023 in [facility] at [facility] from 03/19/2023 in [facility]  Total GAD-7 Score 2 6 1 4 6    PHQ2-9    Flowsheet Row Office Visit from 12/21/2023 in [facility] for [facility] at [facility] Video Visit from 09/23/2023 in [facility] at [place] Office Visit from 06/30/2023 in [facility] Office Visit from 05/10/2023 in [facility] at [facility] from 03/19/2023 in [facility]  PHQ-2 Total Score 2 2 2 1 3   PHQ-9 Total Score 5 7 4  -- 9   Flowsheet Row Video Visit from 09/23/2023 in [facility] at [place] Office Visit from 05/10/2023 in [facility] at [facility] from 03/19/2023 in [facility]  C-SSRS RISK CATEGORY No Risk No Risk No Risk   Assessment and Plan:   Assessment: Summary: Today Zorana C Bekele appears to be doing well.  Reports current medication regimen continues to effectively manage her mental health but feels that she is at a standstill at this point.  Feels that she could use an increase in her Prozac .  Medications adjustments made.  Reports she is eating and sleeping without difficulty.  She denies suicidal/self-harm/homicidal ideation, psychosis, paranoia, and abnormal movements. During visit she is dressed appropriate for age and weather.  She is sitting standing leaning over dest at work.   There is no noted distress.  She is alert/oriented x 4, calm/cooperative and mood is congruent with affect.  She spoke in a clear tone at moderate volume, and normal pace, with good eye contact.  Her thought process is coherent, relevant, and there is no indication that she is currently responding to internal/external stimuli or experiencing delusional thought content.    1. GAD (generalized anxiety disorder) - hydrOXYzine  (ATARAX ) 10 MG tablet; Take 1 tablet (10 mg total) by mouth 3 (three) times daily as needed. for anxiety  Dispense: 90 tablet; Refill: 1 - FLUoxetine  (PROZAC ) 20 MG capsule; Take 1 capsule (20 mg total) by mouth daily. Take with Prozac  10 mg to total 30 mg daily  Dispense: 90 capsule; Refill: 1 - FLUoxetine  (PROZAC ) 10 MG capsule; Take 1 capsule (10 mg total) by mouth daily. Take with Prozac  20 mg to total 30 mg daily  Dispense: 90 capsule; Refill: 1 - DULoxetine  (CYMBALTA ) 60 MG capsule; Take 1 capsule (60 mg total) by mouth daily.  Dispense: 90 capsule; Refill: 1  2. Major depressive disorder, recurrent episode, mild (Primary) - FLUoxetine  (PROZAC ) 20 MG capsule; Take 1 capsule (20 mg total) by mouth daily. Take with Prozac  10 mg to total 30 mg daily  Dispense: 90 capsule; Refill: 1 - FLUoxetine  (PROZAC ) 10 MG capsule; Take 1 capsule (10 mg total) by mouth daily. Take with Prozac  20 mg to total 30 mg daily  Dispense: 90 capsule; Refill: 1 - DULoxetine  (CYMBALTA ) 60 MG capsule; Take 1 capsule (60 mg total) by mouth daily.  Dispense: 90 capsule; Refill: 1    Plan: Medications: Meds ordered this encounter  Medications   hydrOXYzine  (ATARAX ) 10 MG tablet    Sig: Take 1 tablet (10 mg total) by  mouth 3 (three) times daily as needed. for anxiety    Dispense:  90 tablet    Refill:  1    Supervising Provider:   CURRY, SYED T [[id number]]   FLUoxetine  (PROZAC ) 20 MG capsule    Sig: Take 1 capsule (20 mg total) by mouth daily. Take with Prozac  10 mg to total 30 mg daily    Dispense:   90 capsule    Refill:  1    Supervising Provider:   ARFEEN, SYED T [[id number]]   FLUoxetine  (PROZAC ) 10 MG capsule    Sig: Take 1 capsule (10 mg total) by mouth daily. Take with Prozac  20 mg to total 30 mg daily    Dispense:  90 capsule    Refill:  1    Supervising Provider:   ARFEEN, SYED T [[id number]]   DULoxetine  (CYMBALTA ) 60 MG capsule    Sig: Take 1 capsule (60 mg total) by mouth daily.    Dispense:  90 capsule    Refill:  1    Supervising Provider:   CURRY PATERSON T [[id number]]    Labs:  Not indicated at this time Other:   Declines counseling/therapy at this time instructed to call 911, [phone], mobile crisis, or present to the nearest emergency room should she experience any suicidal/homicidal ideation, auditory/visual/hallucinations, or detrimental worsening of her mental health condition.   Patient has participated in the development of this treatment plan and verbalized agreement with plan as listed.  Follow Up: Return in 3 month for medication management Call in the interim for any side-effects, decompensation, questions, or problems  Collaboration of Care: Collaboration of Care: Medication Management AEB medication assessment, adjustment, refills  Patient/Guardian was advised Release of Information must be obtained prior to any record release in order to collaborate their care with an outside provider. Patient/Guardian was advised if they have not already done so to contact the registration department to sign all necessary forms in order for us  to release information regarding their care.   Consent: Patient/Guardian gives verbal consent for treatment and assignment of benefits for services provided during this visit. Patient/Guardian expressed understanding and agreed to proceed.    Ethyle Tiedt, NP 12/27/2023, 2:48 PM

## 2023-12-29 ENCOUNTER — Ambulatory Visit (HOSPITAL_COMMUNITY)

## 2023-12-30 ENCOUNTER — Ambulatory Visit: Admitting: Family Medicine

## 2023-12-31 ENCOUNTER — Ambulatory Visit

## 2023-12-31 VITALS — BP 135/87 | HR 121 | Ht 64.0 in | Wt 190.0 lb

## 2023-12-31 DIAGNOSIS — Z7985 Long-term (current) use of injectable non-insulin antidiabetic drugs: Secondary | ICD-10-CM | POA: Diagnosis not present

## 2023-12-31 DIAGNOSIS — E119 Type 2 diabetes mellitus without complications: Secondary | ICD-10-CM

## 2023-12-31 DIAGNOSIS — J0100 Acute maxillary sinusitis, unspecified: Secondary | ICD-10-CM | POA: Diagnosis not present

## 2023-12-31 DIAGNOSIS — E782 Mixed hyperlipidemia: Secondary | ICD-10-CM

## 2023-12-31 MED ORDER — AMOXICILLIN 875 MG PO TABS: 875.0000 mg | ORAL_TABLET | Freq: Two times a day (BID) | ORAL | 0 refills | Status: AC

## 2023-12-31 MED ORDER — TIRZEPATIDE 10 MG/0.5ML ~~LOC~~ SOAJ: 10.0000 mg | SUBCUTANEOUS | 3 refills | Status: DC

## 2023-12-31 NOTE — Progress Notes (Signed)
 Established Patient Office Visit  Subjective   Patient ID: Sabrina Waters, female    DOB: Nov 25, 1970  Age: 53 y.o. MRN: [id number]  Chief Complaint  Patient presents with   Medical Management of Chronic Issues    Pt here for 6 month follow up for her Diabetes type 2 and hyperlipidemia pt also states some sinus issues going on to     HPI  Patient Active Problem List   Diagnosis Date Noted   Heart burn 11/23/2022   Medication side effect 11/23/2022   Chronic sinusitis 08/13/2022   Type 2 diabetes mellitus (HCC) 07/16/2022   Numerous skin moles 07/03/2022   Weight gain 07/03/2022   Hot flashes 07/03/2022   LGSIL on Pap smear of cervix 11/20/2019   Mixed hyperlipidemia 11/15/2019   Elevated BP without diagnosis of hypertension 11/15/2019   Anxiety and depression 04/26/2018   Snoring 04/26/2018   Sleep apnea 09/29/2016   Complicated migraine 06/17/2015   Hormone replacement therapy (HRT) 01/11/2013   Fatigue 09/12/2012    ROS    Objective:     BP 135/87   Pulse (!) 121   Ht 5' 4 (1.626 m)   Wt 190 lb 0.6 oz (86.2 kg)   SpO2 95%   BMI 32.62 kg/m  BP Readings from Last 3 Encounters:  12/31/23 135/87  12/21/23 (!) 136/96  06/30/23 137/88   Wt Readings from Last 3 Encounters:  12/31/23 190 lb 0.6 oz (86.2 kg)  12/21/23 189 lb 8 oz (86 kg)  06/30/23 187 lb 1.9 oz (84.9 kg)     Physical Exam Vitals and nursing note reviewed.  Constitutional:      Appearance: Normal appearance.  HENT:     Head: Normocephalic.     Right Ear: Tympanic membrane, ear canal and external ear normal.     Left Ear: Tympanic membrane, ear canal and external ear normal.     Nose: Nose normal.     Mouth/Throat:     Mouth: Mucous membranes are moist.     Pharynx: Oropharynx is clear.  Eyes:     Extraocular Movements: Extraocular movements intact.     Pupils: Pupils are equal, round, and reactive to light.  Cardiovascular:     Rate and Rhythm: Normal rate and regular rhythm.   Pulmonary:     Effort: Pulmonary effort is normal.     Breath sounds: Normal breath sounds.  Musculoskeletal:     Cervical back: Normal range of motion and neck supple.  Skin:    General: Skin is warm and dry.  Neurological:     Mental Status: She is alert and oriented to person, place, and time.  Psychiatric:        Mood and Affect: Mood normal.        Thought Content: Thought content normal.      The 10-year ASCVD risk score (Arnett DK, et al., 2019) is: 8.9%    Assessment & Plan:   Problem List Items Addressed This Visit       Endocrine   Type 2 diabetes mellitus (HCC) - Primary   Decrease Mounjaro  to 10 mg for increase in nausea.  Recent A1c levels have been stable.  Will recheck A1c today.      Relevant Medications   tirzepatide  (MOUNJARO ) 10 MG/0.5ML Pen   Other Relevant Orders   Hemoglobin A1C (Completed)     Other   Mixed hyperlipidemia   Currently managed with pravastatin  80 mg.  Check fasting labs today.  Relevant Orders   Lipid Profile (Completed)   CBC with Differential/Platelet (Completed)   BMP8+eGFR (Completed)   Other Visit Diagnoses       Acute non-recurrent maxillary sinusitis       Treat with amoxicillin .  continue with OTC medications for symptom relief.       Return in about 6 months (around 06/29/2024) for chronic follow-up with PCP.    Leita Longs, FNP

## 2024-01-03 ENCOUNTER — Ambulatory Visit (HOSPITAL_COMMUNITY)
Admission: RE | Admit: 2024-01-03 | Discharge: 2024-01-03 | Disposition: A | Discharge status: Home / Self Care | Source: Ambulatory Visit | Attending: Adult Health | Admitting: Adult Health

## 2024-01-03 DIAGNOSIS — E782 Mixed hyperlipidemia: Secondary | ICD-10-CM | POA: Diagnosis not present

## 2024-01-03 DIAGNOSIS — Z1231 Encounter for screening mammogram for malignant neoplasm of breast: Secondary | ICD-10-CM | POA: Insufficient documentation

## 2024-01-04 LAB — LIPID PANEL
Chol/HDL Ratio: 4 ratio (ref 0.0–4.4)
Cholesterol, Total: 147 mg/dL (ref 100–199)
HDL: 37 mg/dL — ABNORMAL LOW (ref >39)
LDL Chol Calc (NIH): 85 mg/dL (ref 0–99)
Triglycerides: 139 mg/dL (ref 0–149)
VLDL Cholesterol Cal: 25 mg/dL (ref 5–40)

## 2024-01-04 LAB — BMP8+EGFR
BUN/Creatinine Ratio: 10 (ref 9–23)
BUN: 8 mg/dL (ref 6–24)
CO2: 23 mmol/L (ref 20–29)
Calcium: 9.5 mg/dL (ref 8.7–10.2)
Chloride: 104 mmol/L (ref 96–106)
Creatinine, Ser: 0.8 mg/dL (ref 0.57–1.00)
Glucose: 95 mg/dL (ref 70–99)
Potassium: 4.4 mmol/L (ref 3.5–5.2)
Sodium: 142 mmol/L (ref 134–144)
eGFR: 89 mL/min/1.73 (ref >59)

## 2024-01-04 LAB — CBC WITH DIFFERENTIAL/PLATELET
Basophils Absolute: 0.1 x10E3/uL (ref 0.0–0.2)
Basos: 1 %
EOS (ABSOLUTE): 0 x10E3/uL (ref 0.0–0.4)
Eos: 1 %
Hematocrit: 47.9 % — ABNORMAL HIGH (ref 34.0–46.6)
Hemoglobin: 15.6 g/dL (ref 11.1–15.9)
Immature Grans (Abs): 0 x10E3/uL (ref 0.0–0.1)
Immature Granulocytes: 0 %
Lymphocytes Absolute: 2.2 x10E3/uL (ref 0.7–3.1)
Lymphs: 32 %
MCH: 31.5 pg (ref 26.6–33.0)
MCHC: 32.6 g/dL (ref 31.5–35.7)
MCV: 97 fL (ref 79–97)
Monocytes Absolute: 0.5 x10E3/uL (ref 0.1–0.9)
Monocytes: 7 %
Neutrophils Absolute: 4 x10E3/uL (ref 1.4–7.0)
Neutrophils: 59 %
Platelets: 235 x10E3/uL (ref 150–450)
RBC: 4.96 x10E6/uL (ref 3.77–5.28)
RDW: 12.5 % (ref 11.7–15.4)
WBC: 6.8 x10E3/uL (ref 3.4–10.8)

## 2024-01-04 LAB — HEMOGLOBIN A1C
Est. average glucose Bld gHb Est-mCnc: 108 mg/dL
Hgb A1c MFr Bld: 5.4 % (ref 4.8–5.6)

## 2024-01-05 ENCOUNTER — Ambulatory Visit: Payer: Self-pay | Admitting: Adult Health

## 2024-01-09 ENCOUNTER — Ambulatory Visit: Payer: Self-pay

## 2024-01-09 NOTE — Assessment & Plan Note (Signed)
 Decrease Mounjaro  to 10 mg for increase in nausea.  Recent A1c levels have been stable.  Will recheck A1c today.

## 2024-01-09 NOTE — Assessment & Plan Note (Signed)
 Currently managed with pravastatin  80 mg.  Check fasting labs today.

## 2024-02-14 ENCOUNTER — Other Ambulatory Visit: Payer: Self-pay

## 2024-02-14 MED ORDER — TIRZEPATIDE 7.5 MG/0.5ML ~~LOC~~ SOAJ: 7.5000 mg | SUBCUTANEOUS | 1 refills | Status: AC

## 2024-03-15 ENCOUNTER — Ambulatory Visit
